# Patient Record
Sex: Female | Born: 1980
Health system: Southern US, Community
[De-identification: ages and names within clinical notes are randomized; demographics above are authoritative.]

## PROBLEM LIST (undated history)

## (undated) DIAGNOSIS — Z720 Tobacco use: Secondary | ICD-10-CM

## (undated) DIAGNOSIS — R823 Hemoglobinuria: Secondary | ICD-10-CM

## (undated) DIAGNOSIS — I10 Essential (primary) hypertension: Secondary | ICD-10-CM

## (undated) HISTORY — PX: CERVICAL CERCLAGE: SHX1329

## (undated) HISTORY — DX: Tobacco use: Z72.0

---
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
## 2016-04-29 ENCOUNTER — Encounter (HOSPITAL_COMMUNITY): Payer: Self-pay

## 2016-04-29 ENCOUNTER — Emergency Department (HOSPITAL_COMMUNITY)
Admission: EM | Admit: 2016-04-29 | Discharge: 2016-04-29 | Disposition: A | Discharge status: Home / Self Care | Payer: Self-pay | Attending: Emergency Medicine | Admitting: Emergency Medicine

## 2016-04-29 DIAGNOSIS — F17200 Nicotine dependence, unspecified, uncomplicated: Secondary | ICD-10-CM | POA: Insufficient documentation

## 2016-04-29 DIAGNOSIS — Z79899 Other long term (current) drug therapy: Secondary | ICD-10-CM | POA: Insufficient documentation

## 2016-04-29 DIAGNOSIS — I10 Essential (primary) hypertension: Secondary | ICD-10-CM | POA: Insufficient documentation

## 2016-04-29 HISTORY — DX: Essential (primary) hypertension: I10

## 2016-04-29 MED ORDER — LISINOPRIL-HYDROCHLOROTHIAZIDE 20-12.5 MG PO TABS: 1.0000 | ORAL_TABLET | Freq: Every day | ORAL | 0 refills | Status: DC

## 2016-04-29 MED ORDER — HYDROCHLOROTHIAZIDE 25 MG PO TABS
25.0000 mg | ORAL_TABLET | Freq: Every day | ORAL | Status: DC
  Administered 2016-04-29: 25 mg via ORAL
  Filled 2016-04-29: qty 1

## 2016-04-29 MED ORDER — LISINOPRIL 20 MG PO TABS
20.0000 mg | ORAL_TABLET | Freq: Once | ORAL | Status: AC
  Administered 2016-04-29: 20 mg via ORAL
  Filled 2016-04-29: qty 1

## 2016-04-29 NOTE — ED Notes (Signed)
Pt is in stable condition upon d/c and ambulates from ED. 

## 2016-04-29 NOTE — ED Triage Notes (Signed)
Pt presents with 2 day h/o headache.  Pt had her blood pressure checked and was referred here.  Pt just moved here and had been out of lisinopril/HCTZ x 3 days.

## 2016-04-29 NOTE — ED Provider Notes (Signed)
[facility]-EMERGENCY DEPT Provider Note   CSN: [id number] Arrival date & time: 04/29/16  1222     History   Chief Complaint No chief complaint on file.   HPI Hannah Lutz is a 35 y.o. female.  Pt presents to the ED today with HTN.  [place] [facility] and is out of her blood pressure medications.  She takes lisinopril/hctz.  The pt said that she had a headache, so she asked for some ibuprofen.  She said her boss made her go to the nurse.  They checked her blood pressure.  It was high, so they wanted her to come to the doctor.  She denies cp or any other sx.       Past Medical History:  Diagnosis Date  . Hypertension     There are no active problems to display for this patient.   History reviewed. No pertinent surgical history.  OB History    No data available       Home Medications    Prior to Admission medications   Medication Sig Start Date End Date Taking? Authorizing Provider  lisinopril-hydrochlorothiazide (ZESTORETIC) 20-12.5 MG tablet Take 1 tablet by mouth daily. 04/29/16   Jacalyn LefevreJulie Khadeeja Elden, MD    Family History History reviewed. No pertinent family history.  Social History Social History  Substance Use Topics  . Smoking status: Current Some Day Smoker  . Smokeless tobacco: Never Used  . Alcohol use Not on file     Allergies   Tramadol   Review of Systems Review of Systems  Neurological: Positive for headaches.  All other systems reviewed and are negative.    Physical Exam Updated Vital Signs BP (!) 154/120   Pulse 81   Temp 98 F (36.7 C) (Oral)   Resp 18   Ht 5\' 6"  (1.676 m)   Wt 190 lb (86.2 kg)   LMP 04/29/2016 (Exact Date)   SpO2 99%   BMI 30.67 kg/m   Physical Exam  Constitutional: She appears well-developed and well-nourished.  HENT:  Head: Normocephalic and atraumatic.  Right Ear: External ear normal.  Left Ear: External ear normal.  Nose: Nose normal.  Mouth/Throat: Oropharynx is clear and moist.    Eyes: Conjunctivae and EOM are normal. Pupils are equal, round, and reactive to light.  Neck: Normal range of motion. Neck supple.  Cardiovascular: Normal rate, regular rhythm, normal heart sounds and intact distal pulses.   Pulmonary/Chest: Effort normal and breath sounds normal.  Abdominal: Soft. Bowel sounds are normal.  Musculoskeletal: Normal range of motion.  Neurological: She is alert.  Skin: Skin is warm.  Psychiatric: She has a normal mood and affect. Her behavior is normal. Judgment and thought content normal.  Nursing note and vitals reviewed.    ED Treatments / Results  Labs (all labs ordered are listed, but only abnormal results are displayed) Labs Reviewed - No data to display  EKG  EKG Interpretation None       Radiology No results found.  Procedures Procedures (including critical care time)  Medications Ordered in ED Medications  lisinopril (PRINIVIL,ZESTRIL) tablet 20 mg (not administered)  hydrochlorothiazide (HYDRODIURIL) tablet 25 mg (not administered)     Initial Impression / Assessment and Plan / ED Course  I have reviewed the triage vital signs and the nursing notes.  Pertinent labs & imaging results that were available during my care of the patient were reviewed by me and considered in my medical decision making (see chart for details).  Clinical  Course     Pt's sx are chronic.  She will be given a rx for her medications.  Her insurance from work is kicking in soon, so she will establish a pcp as soon as that occurs.  She knows to return if worse.  Final Clinical Impressions(s) / ED Diagnoses   Final diagnoses:  Essential hypertension    New Prescriptions New Prescriptions   LISINOPRIL-HYDROCHLOROTHIAZIDE (ZESTORETIC) 20-12.5 MG TABLET    Take 1 tablet by mouth daily.     Jacalyn LefevreJulie Amorah Sebring, MD 04/29/16 [phone]

## 2016-04-29 NOTE — ED Notes (Signed)
Pt states she was sent here from her job to get a note stating she is able to work with no restrictions. Pt states she only needs her BP medications and the note. Pt informed that sounds reasonable.

## 2016-10-18 ENCOUNTER — Other Ambulatory Visit (HOSPITAL_COMMUNITY)
Admission: RE | Admit: 2016-10-18 | Discharge: 2016-10-18 | Disposition: A | Discharge status: Home / Self Care | Payer: 59 | Source: Ambulatory Visit | Attending: Physician Assistant | Admitting: Physician Assistant

## 2016-10-18 ENCOUNTER — Ambulatory Visit (INDEPENDENT_AMBULATORY_CARE_PROVIDER_SITE_OTHER): Payer: 59 | Admitting: Physician Assistant

## 2016-10-18 ENCOUNTER — Encounter: Payer: Self-pay | Admitting: Physician Assistant

## 2016-10-18 VITALS — BP 131/92 | HR 98 | Wt 191.0 lb

## 2016-10-18 DIAGNOSIS — Z0000 Encounter for general adult medical examination without abnormal findings: Secondary | ICD-10-CM

## 2016-10-18 DIAGNOSIS — Z803 Family history of malignant neoplasm of breast: Secondary | ICD-10-CM | POA: Diagnosis not present

## 2016-10-18 DIAGNOSIS — Z720 Tobacco use: Secondary | ICD-10-CM | POA: Diagnosis not present

## 2016-10-18 DIAGNOSIS — Z6830 Body mass index (BMI) 30.0-30.9, adult: Secondary | ICD-10-CM

## 2016-10-18 DIAGNOSIS — Z124 Encounter for screening for malignant neoplasm of cervix: Secondary | ICD-10-CM

## 2016-10-18 DIAGNOSIS — I10 Essential (primary) hypertension: Secondary | ICD-10-CM | POA: Diagnosis not present

## 2016-10-18 DIAGNOSIS — Z1231 Encounter for screening mammogram for malignant neoplasm of breast: Secondary | ICD-10-CM | POA: Diagnosis not present

## 2016-10-18 DIAGNOSIS — E6609 Other obesity due to excess calories: Secondary | ICD-10-CM | POA: Diagnosis not present

## 2016-10-18 DIAGNOSIS — Z1239 Encounter for other screening for malignant neoplasm of breast: Secondary | ICD-10-CM

## 2016-10-18 DIAGNOSIS — G4452 New daily persistent headache (NDPH): Secondary | ICD-10-CM | POA: Diagnosis not present

## 2016-10-18 DIAGNOSIS — Z7689 Persons encountering health services in other specified circumstances: Secondary | ICD-10-CM

## 2016-10-18 DIAGNOSIS — Z7251 High risk heterosexual behavior: Secondary | ICD-10-CM | POA: Diagnosis not present

## 2016-10-18 DIAGNOSIS — Z973 Presence of spectacles and contact lenses: Secondary | ICD-10-CM

## 2016-10-18 HISTORY — DX: New daily persistent headache (ndph): G44.52

## 2016-10-18 HISTORY — DX: Presence of spectacles and contact lenses: Z97.3

## 2016-10-18 LAB — CBC
HEMATOCRIT: 41 % (ref 35.0–45.0)
Hemoglobin: 12.9 g/dL (ref 11.7–15.5)
MCH: 25.3 pg — ABNORMAL LOW (ref 27.0–33.0)
MCHC: 31.5 g/dL — AB (ref 32.0–36.0)
MCV: 80.4 fL (ref 80.0–100.0)
MPV: 10.5 fL (ref 7.5–12.5)
PLATELETS: 370 10*3/uL (ref 140–400)
RBC: 5.1 MIL/uL (ref 3.80–5.10)
RDW: 14.5 % (ref 11.0–15.0)
WBC: 8 10*3/uL (ref 3.8–10.8)

## 2016-10-18 MED ORDER — VALSARTAN-HYDROCHLOROTHIAZIDE 160-25 MG PO TABS: 1.0000 | ORAL_TABLET | Freq: Every day | ORAL | 3 refills | Status: DC

## 2016-10-18 NOTE — Progress Notes (Signed)
HPI:                                                                Hannah Lutz is a 36 y.o. female who presents to [name] today to establish care / annual physical exam  Current Concerns include headache and blood pressure  HTN: taking Lisinopril-HCTZ. States she does not always take her medication, because she thinks it may be causing headaches. Patient also presented to ED on 04/29/16 with elevated BP and headache. Denies vision change, chest pain with exertion, orthopnea, lightheadedness, syncope and edema. Risk factors include: tobacco use  Patient reports daily frontal headaches that she describes as pressure behind her eyes. She reports they have been worsening for the last 2.5 months. She has been taking Excedrin and Tylenol daily, which initially helped, but she states the headaches always return. She has also been taking oral decongestants attributing the headaches to sinus congestion. She denies vision change, nausea, photophobia, paresthesias, or focal weakness. She denies nasal congestion, rhinorrhea, sneezing, or cough.  Patient also reports she had unprotected vaginal intercourse in March and has been having vaginal discharge since that time. Reports LMP 09/24/16. Denies vaginal lesions/rash, pelvic pain, fever or chills.   Patient is also requesting a screening mammogram for breast cancer today. She states her mother passed from breast cancer at age 37. Her maternal aunt and grandmother also had breast cancer.  Health Maintenance Health Maintenance  Topic Date Due  . HIV Screening  07/13/1993  . TETANUS/TDAP  07/13/1997  . PAP SMEAR  07/14/1999  . INFLUENZA VACCINE  01/15/2017    GYN/Sexual Health  Menstrual status:   LMP:  Menses:  Last pap smear:   History of abnormal pap smears:  Sexually active:  Current contraception:  Health Habits  Diet:  Exercise:  Past Medical History:  Diagnosis Date   . Hypertension   . Tobacco use    Past Surgical History:  Procedure Laterality Date  . CERVICAL CERCLAGE     Social History  Substance Use Topics  . Smoking status: Current Some Day Smoker    Types: Cigarettes  . Smokeless tobacco: Never Used  . Alcohol use Yes   family history includes Breast cancer in her maternal aunt, maternal grandmother, and mother; Hypertension in her father.  ROS: negative except as noted in the HPI  Medications: Current Outpatient Prescriptions  Medication Sig Dispense Refill  . valsartan-hydrochlorothiazide (DIOVAN-HCT) 160-25 MG tablet Take 1 tablet by mouth daily. 30 tablet 3   No current facility-administered medications for this visit.    Allergies  Allergen Reactions  . Tramadol Nausea And Vomiting       Objective:  BP (!) 131/92   Pulse 98   Wt 191 lb (86.6 kg)   BMI 30.83 kg/m  Gen: obese, not ill-appearing, no distress HEENT: normal conjunctiva, TM's clear, oropharynx clear, moist mucus membranes, no thyromegaly or tenderness Pulm: Normal work of breathing, normal phonation, clear to auscultation bilaterally CV: Normal rate, regular rhythm, s1 and s2 distinct, no murmurs, clicks or rubs, no carotid bruit GI: abdomen soft, nondistended, nontender, no masses GU: vulva without rashes or lesions, normal introitus and urethral meatus, vaginal mucosa without erythema, moderate amount of white discharge, cervix non-friable without  lesions, adnexa without masses or tenderness, uterus non-tender and not enlarged Neuro:  cranial nerves II-XII intact, no nystagmus, no papilledema, normal finger-to-nose, normal heel-to-shin, negative pronator drift, normal coordination, DTR's intact, normal tone, no tremor MSK: strength 5/5 and symmetric in bilateral upper and lower extremities, normal gait and station Mental Status: alert and oriented x 3, normal speech, cooperative, organized thought content Skin: warm and dry, no rashes or lesions on exposed  skin Psych: appearance casual, normal affect, euthymic mood  A chaperone was used for the GU portion of the exam Daniel Nones(Euleta Douglas, LPN)  No results found for this or any previous visit (from the past 72 hour(s)). No results found.  Depression screen PHQ 2/9 10/18/2016  Decreased Interest 0  Down, Depressed, Hopeless 0  PHQ - 2 Score 0     Assessment and Plan: 36 y.o. female with   Annual physical exam, Encounter to establish care - CBC - Comprehensive metabolic panel - Lipid Panel w/reflex Direct LDL - Hemoglobin A1c - negative depression screen - counseling provided on heart healthy lifestyle including but not limited to smoking cessation, maintaining a healthy weight, and regular cardiovascular exercise  Family history of breast cancer in first degree relative, Breast cancer screening, high risk patient - MM DIGITAL SCREENING BILATERAL; Future  Unprotected sexual intercourse - HIV antibody - Hepatitis C antibody - RPR - SureSwab, T.vaginalis RNA,Ql,Female - GC/Chlamydia Probe Amp - patient declined urine pregnancy test  Encounter for Papanicolaou smear for cervical cancer screening - Cytology - PAP w/HPV cotesting  New daily persistent headache - reassuring neurologic exam without focal deficits - MR BRAIN W WO CONTRAST - recommended patient discontinue OTC decongestants and pain relievers to avoid rebound headache - will switch antihypertensive medication to see if this makes a difference  Essential hypertension - Stop Lisinopril-HCTZ and start Valsartan-HCTZ daily - Check blood pressure at home for the next 2 weeks - Check around the same time each day in a relaxed setting (rest for at least 3 minutes, legs uncrossed, arm above level or your heart) - Limit salt. Follow DASH eating plan - Follow-up in 2 weeks for a nurse visit blood pressure check  Patient education and anticipatory guidance given Patient agrees with treatment plan Follow-up in 2 weeks or sooner  as needed  Levonne Hubertharley E. Rosina Cressler PA-C

## 2016-10-18 NOTE — Patient Instructions (Addendum)
For your blood pressure: - Stop Lisinopril-HCTZ and start Valsartan-HCTZ daily - Check blood pressure at home for the next 2 weeks - Check around the same time each day in a relaxed setting (rest for at least 3 minutes, legs uncrossed, arm above level or your heart) - Limit salt. Follow DASH eating plan - Follow-up in 2 weeks for a nurse visit blood pressure check   I have ordered your screening mammogram The best thing to do will be to contact your insurance company prior to having your mammogram to find out what your co-pay will be  Go downstairs for lab work. We will contact you within 2-3 business days with your results. Sometimes Pap smear results take longer   Physical Activity Recommendations for modifying lipids and lowering blood pressure Engage in aerobic physical activity to reduce LDL-cholesterol, non-HDL-cholesterol, and blood pressure  Frequency: 3-4 sessions per week  Intensity: moderate to vigorous  Duration: 40 minutes on average  Physical Activity Recommendations for secondary prevention 1. Aerobic exercise  Frequency: 3-5 sessions per week  Intensity: 50-80% capacity  Duration: 20 - 60 minutes  Examples: walking, treadmill, cycling, rowing, stair climbing, and arm/leg ergometry  2. Resistance exercise  Frequency: 2-3 sessions per week  Intensity: 10-15 repetitions/set to moderate fatigue  Duration: 1-3 sets of 8-10 upper and lower body exercises  Examples: calisthenics, elastic bands, cuff/hand weights, dumbbels, free weights, wall pulleys, and weight machines  Heart-Healthy Lifestyle  Eating a diet rich in vegetables, fruits and whole grains: also includes low-fat dairy products, poultry, fish, legumes, and nuts; limit intake of sweets, sugar-sweetened beverages and red meats  Getting regular exercise  Maintaining a healthy weight  Not smoking or getting help quitting  Staying on top of your health; for some people, lifestyle changes alone may  not be enough to prevent a heart attack or stroke. In these cases, taking a statin at the right dose will most likely be necessary   Preventive Care 18-39 Years, Female Preventive care refers to lifestyle choices and visits with your health care provider that can promote health and wellness. What does preventive care include?  A yearly physical exam. This is also called an annual well check.  Dental exams once or twice a year.  Routine eye exams. Ask your health care provider how often you should have your eyes checked.  Personal lifestyle choices, including:  Daily care of your teeth and gums.  Regular physical activity.  Eating a healthy diet.  Avoiding tobacco and drug use.  Limiting alcohol use.  Practicing safe sex.  Taking vitamin and mineral supplements as recommended by your health care provider. What happens during an annual well check? The services and screenings done by your health care provider during your annual well check will depend on your age, overall health, lifestyle risk factors, and family history of disease. Counseling  Your health care provider may ask you questions about your:  Alcohol use.  Tobacco use.  Drug use.  Emotional well-being.  Home and relationship well-being.  Sexual activity.  Eating habits.  Work and work Statistician.  Method of birth control.  Menstrual cycle.  Pregnancy history. Screening  You may have the following tests or measurements:  Height, weight, and BMI.  Diabetes screening. This is done by checking your blood sugar (glucose) after you have not eaten for a while (fasting).  Blood pressure.  Lipid and cholesterol levels. These may be checked every 5 years starting at age 1.  Skin check.  Hepatitis C  blood test.  Hepatitis B blood test.  Sexually transmitted disease (STD) testing.  BRCA-related cancer screening. This may be done if you have a family history of breast, ovarian, tubal, or  peritoneal cancers.  Pelvic exam and Pap test. This may be done every 3 years starting at age 73. Starting at age 1, this may be done every 5 years if you have a Pap test in combination with an HPV test. Discuss your test results, treatment options, and if necessary, the need for more tests with your health care provider. Vaccines  Your health care provider may recommend certain vaccines, such as:  Influenza vaccine. This is recommended every year.  Tetanus, diphtheria, and acellular pertussis (Tdap, Td) vaccine. You may need a Td booster every 10 years.  Varicella vaccine. You may need this if you have not been vaccinated.  HPV vaccine. If you are 82 or younger, you may need three doses over 6 months.  Measles, mumps, and rubella (MMR) vaccine. You may need at least one dose of MMR. You may also need a second dose.  Pneumococcal 13-valent conjugate (PCV13) vaccine. You may need this if you have certain conditions and were not previously vaccinated.  Pneumococcal polysaccharide (PPSV23) vaccine. You may need one or two doses if you smoke cigarettes or if you have certain conditions.  Meningococcal vaccine. One dose is recommended if you are age 62-21 years and a first-year college student living in a residence hall, or if you have one of several medical conditions. You may also need additional booster doses.  Hepatitis A vaccine. You may need this if you have certain conditions or if you travel or work in places where you may be exposed to hepatitis A.  Hepatitis B vaccine. You may need this if you have certain conditions or if you travel or work in places where you may be exposed to hepatitis B.  Haemophilus influenzae type b (Hib) vaccine. You may need this if you have certain risk factors. Talk to your health care provider about which screenings and vaccines you need and how often you need them. This information is not intended to replace advice given to you by your health care  provider. Make sure you discuss any questions you have with your health care provider. Document Released: 07/30/2001 Document Revised: 02/21/2016 Document Reviewed: 04/04/2015 Elsevier Interactive Patient Education  2017 [place], Adult Preventive dental care includes seeing a dentist regularly and practicing good dental care (oral hygiene) at home. These actions can help to prevent cavities and other tooth problems, root canal problems, gum disease (gingivitis), and tooth loss. Regular dental exams may also help your health care provider to diagnose other medical problems. Many diseases, including mouth cancers, have early signs that can be found during a preventive dental care visit. What can I expect during my dental visits? Many adults see their dentist one or two times each year for oral exams and cleanings. Talk with your dentist about the best preventive dental care schedule for you. At your visit, your dentist may ask you about:  Your overall health and diet.  Any new symptoms, such as:  Bleeding gums.  Mouth, tooth, or jaw pain. Your dentist will do a mouth (oral) exam to check for:  Cavities.  Gingivitis or other problems.  Signs of cancer.  Neck swelling or lumps.  Abnormal jaw movement or pain in the jaw joint. You may also have:  Dental X-rays.  Your teeth cleaned. If you  have an early problem, like a cavity, your dentist will schedule time for you to get treatment. If you have a tooth root problem, gum disease, or a sign of another disease, your dentist may send you to see another health care provider for care. How can I care for my teeth at home?  Brush with an approved fluoride toothpaste every morning and night. If possible, brush within 10 minutes after every meal.  Floss one time every day.  Periodically check your teeth for white or brown spots after brushing. These may be signs of cavities.  Check your gums for swelling or  bleeding. These may be signs of gum disease, such as gingivitis or periodontitis.  Make sure your diet includes plenty of fruits, vegetables, milk and dairy products, whole grains, and proteins. Do not eat a lot of starchy foods or foods with added sugar. Talk with your health care provider if you have questions about following a healthy diet.  Avoid sodas, sugary snacks, and sticky candies.  Do not smoke.  Do not get mouth piercings.  If you have tooth or gum pain, gargle with a salt-water mixture 3-4 times per day or as needed. To make a salt-water mixture, completely dissolve -1 tsp of salt in 1 cup of warm water.  Take over-the counter and prescription medicines only as told by your dentist.  If you have a permanent tooth knocked out:  Find the tooth.  Pick it up by the top (crown) with a tissue or gauze.  Wash the tooth for no more than 10 seconds under cold, running water.  Try to put the tooth back into the gum socket.  Put the tooth in a glass of milk if you cannot get it back in place.  Go to your dentist right away. Take the tooth with you. When should I seek medical care? Call your dentist if you have:  Gum, tooth, or jaw pain.  Red, swollen, or bleeding gums.  A tooth or teeth that are very sensitive to hot or cold.  Very bad breath.  A problem with a filling, crown, implant, or denture.  A broken or loose tooth.  A growth or sore in your mouth that is not going away. Where to find more information: American Dental Association: http://clayton-rivera.info/ This information is not intended to replace advice given to you by your health care provider. Make sure you discuss any questions you have with your health care provider. Document Released: 02/22/2015 Document Revised: 11/09/2015 Document Reviewed: 11/15/2014 Elsevier Interactive Patient Education  2017 [employer].

## 2016-10-19 LAB — COMPREHENSIVE METABOLIC PANEL
ALBUMIN: 3.8 g/dL (ref 3.6–5.1)
ALK PHOS: 71 U/L (ref 33–115)
ALT: 12 U/L (ref 6–29)
AST: 13 U/L (ref 10–30)
BILIRUBIN TOTAL: 0.5 mg/dL (ref 0.2–1.2)
BUN: 12 mg/dL (ref 7–25)
CO2: 23 mmol/L (ref 20–31)
CREATININE: 0.89 mg/dL (ref 0.50–1.10)
Calcium: 9.2 mg/dL (ref 8.6–10.2)
Chloride: 105 mmol/L (ref 98–110)
Glucose, Bld: 85 mg/dL (ref 65–99)
Potassium: 4.5 mmol/L (ref 3.5–5.3)
SODIUM: 138 mmol/L (ref 135–146)
TOTAL PROTEIN: 7.3 g/dL (ref 6.1–8.1)

## 2016-10-19 LAB — HEMOGLOBIN A1C
Hgb A1c MFr Bld: 5.2 % (ref <5.7)
MEAN PLASMA GLUCOSE: 103 mg/dL

## 2016-10-19 LAB — LIPID PANEL W/REFLEX DIRECT LDL
CHOLESTEROL: 161 mg/dL (ref <200)
HDL: 45 mg/dL — ABNORMAL LOW (ref >50)
LDL-Cholesterol: 96 mg/dL
Non-HDL Cholesterol (Calc): 116 mg/dL (ref <130)
TRIGLYCERIDES: 108 mg/dL (ref <150)
Total CHOL/HDL Ratio: 3.6 Ratio (ref <5.0)

## 2016-10-19 LAB — HIV ANTIBODY (ROUTINE TESTING W REFLEX): HIV: NONREACTIVE

## 2016-10-19 LAB — HEPATITIS C ANTIBODY: HCV AB: NEGATIVE

## 2016-10-19 LAB — RPR

## 2016-10-21 LAB — GC/CHLAMYDIA PROBE AMP
CT Probe RNA: NOT DETECTED
GC Probe RNA: NOT DETECTED

## 2016-10-21 LAB — SURESWAB, T.VAGINALIS RNA,QL,FEMALE: Trichomonas vaginalis RNA: NOT DETECTED

## 2016-10-22 LAB — CYTOLOGY - PAP
Adequacy: ABSENT
Diagnosis: NEGATIVE
HPV (WINDOPATH): NOT DETECTED

## 2017-08-04 ENCOUNTER — Other Ambulatory Visit: Payer: Self-pay | Admitting: Physician Assistant

## 2017-08-28 ENCOUNTER — Encounter: Payer: 59 | Admitting: Physician Assistant

## 2017-09-01 ENCOUNTER — Ambulatory Visit (INDEPENDENT_AMBULATORY_CARE_PROVIDER_SITE_OTHER): Payer: 59 | Admitting: Physician Assistant

## 2017-09-01 ENCOUNTER — Encounter: Payer: Self-pay | Admitting: Physician Assistant

## 2017-09-01 ENCOUNTER — Other Ambulatory Visit (HOSPITAL_COMMUNITY)
Admission: RE | Admit: 2017-09-01 | Discharge: 2017-09-01 | Disposition: A | Discharge status: Home / Self Care | Payer: 59 | Source: Ambulatory Visit | Attending: Physician Assistant | Admitting: Physician Assistant

## 2017-09-01 VITALS — BP 147/101 | HR 98 | Wt 203.0 lb

## 2017-09-01 DIAGNOSIS — Z1239 Encounter for other screening for malignant neoplasm of breast: Secondary | ICD-10-CM

## 2017-09-01 DIAGNOSIS — I10 Essential (primary) hypertension: Secondary | ICD-10-CM | POA: Diagnosis not present

## 2017-09-01 DIAGNOSIS — E6609 Other obesity due to excess calories: Secondary | ICD-10-CM

## 2017-09-01 DIAGNOSIS — Z0000 Encounter for general adult medical examination without abnormal findings: Secondary | ICD-10-CM | POA: Diagnosis not present

## 2017-09-01 DIAGNOSIS — Z7251 High risk heterosexual behavior: Secondary | ICD-10-CM

## 2017-09-01 DIAGNOSIS — Z1231 Encounter for screening mammogram for malignant neoplasm of breast: Secondary | ICD-10-CM

## 2017-09-01 DIAGNOSIS — Z803 Family history of malignant neoplasm of breast: Secondary | ICD-10-CM | POA: Diagnosis not present

## 2017-09-01 DIAGNOSIS — Z113 Encounter for screening for infections with a predominantly sexual mode of transmission: Secondary | ICD-10-CM

## 2017-09-01 HISTORY — DX: Encounter for other screening for malignant neoplasm of breast: Z12.39

## 2017-09-01 MED ORDER — CANDESARTAN CILEXETIL 32 MG PO TABS: ORAL_TABLET | ORAL | 5 refills | Status: DC

## 2017-09-01 NOTE — Patient Instructions (Addendum)
You cannot repeat your Pap smear until after May 4th  You will be contacted to schedule your mammogram  For your blood pressure: - Goal <130/80 - start Candesartan 1/2 tablet in the morning for 1 week, then increase to the whole tablet - monitor and log blood pressures at home - check around the same time each day in a relaxed setting - Limit salt to <2000 mg/day - Follow DASH eating plan - limit alcohol to 2 standard drinks per day for men and 1 per day for women - avoid tobacco products - weight loss: 7% of current body weight  Physical Activity Recommendations for modifying lipids and lowering blood pressure Engage in aerobic physical activity to reduce LDL-cholesterol, non-HDL-cholesterol, and blood pressure  Frequency: 3-4 sessions per week  Intensity: moderate to vigorous  Duration: 40 minutes on average   1. Aerobic exercise  Frequency: 3-5 sessions per week  Intensity: 50-80% capacity  Duration: 20 - 60 minutes  Examples: walking, treadmill, cycling, rowing, stair climbing, and arm/leg ergometry  2. Resistance exercise  Frequency: 2-3 sessions per week  Intensity: 10-15 repetitions/set to moderate fatigue  Duration: 1-3 sets of 8-10 upper and lower body exercises  Examples: calisthenics, elastic bands, cuff/hand weights, dumbbels, free weights, wall pulleys, and weight machines  Heart-Healthy Lifestyle  Eating a diet rich in vegetables, fruits and whole grains: also includes low-fat dairy products, poultry, fish, legumes, and nuts; limit intake of sweets, sugar-sweetened beverages and red meats  Getting regular exercise  Maintaining a healthy weight  Not smoking or getting help quitting  Staying on top of your health; for some people, lifestyle changes alone may not be enough to prevent a heart attack or stroke. In these cases, taking a statin at the right dose will most likely be necessary

## 2017-09-01 NOTE — Progress Notes (Signed)
HPI:                                                                Hannah HawthorneCosandra Lutz is a 37 y.o. female who [facility] today for annual physical exam  Current Concerns include STI testing  Reports unprotected intercourse with 1 female partner about 4 weeks ago. Denies vaginal discharge or GU rashes. Reports some intermittent itching of her inner thighs.   GYN/Sexual Health  Obstetrics: [phone]  Menstrual status: having periods  LMP: 08/23/2017  Menses: regular  Last pap smear: 10/2016, NILM, HPV negative  History of abnormal pap smears: yes, many years ago  Sexually active: yes  Current contraception: none  History of STI: yes  Dep[facility] screen [facility] 2/9 09/01/2017 10/18/2016  Decreased Interest 0 0  Down, Depressed, Hopeless 0 0  PHQ - 2 Score 0 0    Health Maintenance Health Maintenance  Topic Date Due  . TETANUS/TDAP  07/13/1997  . INFLUENZA VACCINE  05/04/2018 (Originally 01/15/2017)  . PAP SMEAR  10/19/2019  . HIV Screening  Completed    Past Medical History:  Diagnosis Date  . Hypertension   . Tobacco use    Past Surgical History:  Procedure Laterality Date  . CERVICAL CERCLAGE     Social History   Tobacco Use  . Smoking status: Current Some Day Smoker    Types: Cigarettes  . Smokeless tobacco: Never Used  Substance Use Topics  . Alcohol use: Yes   family history includes Breast cancer in her maternal aunt, maternal grandmother, and mother; Hypertension in her father.  ROS: negative except as noted in the HPI  Medications: Current Outpatient Medications  Medication Sig Dispense Refill  . valsartan-hydrochlorothiazide (DIOVAN-HCT) 160-25 MG tablet Take 1 tablet by mouth daily. Due for follow up visit 15 tablet 0   No current facility-administered medications for this visit.    Allergies  Allergen Reactions  . Tramadol Nausea And Vomiting       Objective:  BP (!) 147/101   Pulse  98   Wt 203 lb (92.1 kg)   LMP 08/23/2017 (Approximate)   BMI 32.77 kg/m  General Appearance:  Alert, cooperative, no distress, appropriate for age, obese female                            Head:  Normocephalic, without obvious abnormality                             Eyes:  PERRL, EOM's intact, conjunctiva and cornea clear, wearing contact lenses                             Ears:  TM pearly gray color and semitransparent, external ear canals normal, both ears                            Nose:  Nares symmetrical                          Throat:  Lips, tongue,  and mucosa are moist, pink, and intact; good dentition                             Neck:  Supple; symmetrical, trachea midline, no adenopathy; thyroid: no enlargement, symmetric, no tenderness/mass/nodules                             Back:  Symmetrical, no curvature, ROM normal               Chest/Breast:  Breasts symmetric, no skin dimpling, no nipple inversion, no palpable masses or tenderness                           Lungs:  Clear to auscultation bilaterally, respirations unlabored                             Heart:  regular rate & normal rhythm, S1 and S2 normal, no murmurs, rubs, or gallops                     Abdomen:  Soft, non-tender, no mass or organomegaly              Genitourinary:  vulva without rashes or lesions, normal introitus and urethral meatus, vaginal mucosa without erythema, normal discharge, cervix non-friable without lesions, there is suture material protruding from the cervical os         Musculoskeletal:  Tone and strength strong and symmetrical, all extremities; no joint pain or edema, normal gait and station                                       Lymphatic:  No adenopathy             Skin/Hair/Nails:  Skin warm, dry and intact, no rashes or abnormal dyspigmentation on limited exam                   Neurologic:  Alert and oriented x3, no cranial nerve deficits, DTR's intact, sensation grossly intact, normal gait and  station, no tremor Psych: well-groomed, cooperative, good eye contact, euthymic mood, affect mood-congruent, speech is articulate, and thought processes clear and goal-directed  A chaperone was used for the GU and breast portion of the exam, Olivia Mackie, CMA.    No results found for this or any previous visit (from the past 72 hour(s)). No results found.    Assessment and Plan: 37 y.o. female with   1. Encounter for annual physical exam - reviewed health maintenance - Pap UTD, NILM. She would like to do annual pap smears. Explained she will need to wait until after 10/18/2017 for insurance to pay for this - she is a candidate for early breast cancer screening due to family history - fasting labs 10 months ago  2. Unprotected sexual intercourse - asymptomatic, will defer treatment pending results. She has had a menstrual period - Hepatitis C antibody - HIV antibody - RPR - C. trachomatis/N. gonorrhoeae RNA - Trichomonas vaginalis, RNA  3. Routine screening for STI (sexually transmitted infection) - Hepatitis C antibody - HIV antibody - RPR - C. trachomatis/N. gonorrhoeae RNA - Trichomonas vaginalis, RNA  4. Hypertension goal BP (blood pressure) < 130/80 BP Readings  from Last 3 Encounters:  09/01/17 (!) 147/101  10/18/16 (!) 131/92  04/29/16 (!) 149/101  - reports headache on combination medication. This is likely the thiazide component. Cough with ACE. Will try ARB alone and add CCB if needed - counseled on therapeutic lifestyle changes - candesartan (ATACAND) 32 MG tablet; 1/2 tablet daily for 1 week, the 1 tablet daily  Dispense: 30 tablet; Refill: 5  5. Class 1 obesity due to excess calories with serious comorbidity in adult, unspecified BMI Wt Readings from Last 3 Encounters:  09/01/17 203 lb (92.1 kg)  10/18/16 191 lb (86.6 kg)  04/29/16 190 lb (86.2 kg)  - counseled on weight loss through decreased caloric intake and increased aerobic exercise   6. Family  history of breast cancer in first degree relative - MM SCREENING BREAST TOMO BILATERAL; Future  7. Breast cancer screening, high risk patient - family hx of breast ca in mother, maternal aunt and maternal GM. We discussed that she is a candidate for genetic testing, which may allow her to access annual breast MRI. She is going to contact her insurance company to find out about cost - MM SCREENING BREAST TOMO BILATERAL; Future    Patient education and anticipatory guidance given Patient agrees with treatment plan Follow-up in 2 weeks for nurse BP check, then every 6 months for medication management  Levonne Hubert PA-C

## 2017-09-02 ENCOUNTER — Other Ambulatory Visit: Payer: Self-pay

## 2017-09-02 ENCOUNTER — Encounter: Payer: Self-pay | Admitting: Physician Assistant

## 2017-09-02 ENCOUNTER — Other Ambulatory Visit: Payer: Self-pay | Admitting: Physician Assistant

## 2017-09-02 DIAGNOSIS — Z113 Encounter for screening for infections with a predominantly sexual mode of transmission: Secondary | ICD-10-CM

## 2017-09-02 DIAGNOSIS — Z7251 High risk heterosexual behavior: Secondary | ICD-10-CM

## 2017-09-02 NOTE — Progress Notes (Signed)
Erin HearingHey Ev, It looks like we didn't send the order with the specimen. This was because it was originally ordered on the Pap.  I have put the new order in, if you can contact [facility] to get this fixed. Thanks! Vinetta Bergamoharley

## 2017-09-03 LAB — CYTOLOGY - PAP
CHLAMYDIA, DNA PROBE: NEGATIVE
DIAGNOSIS: NEGATIVE
HPV (WINDOPATH): NOT DETECTED
Neisseria Gonorrhea: NEGATIVE
Trichomonas: NEGATIVE

## 2017-09-04 NOTE — Progress Notes (Signed)
Testing negative for gonorrhea, chlamydia and trichomonas Still waiting on blood work

## 2017-09-05 LAB — TEST AUTHORIZATION

## 2017-09-05 LAB — TIQ-NTM

## 2017-09-05 LAB — SURESWAB CT/NG/T. VAGINALIS
C. trachomatis RNA, TMA: NOT DETECTED
N. GONORRHOEAE RNA, TMA: NOT DETECTED
Trichomonas vaginalis RNA: NOT DETECTED

## 2017-09-05 LAB — HEPATITIS C ANTIBODY

## 2017-09-05 LAB — RPR

## 2017-09-05 LAB — HIV ANTIBODY (ROUTINE TESTING W REFLEX)

## 2017-09-08 ENCOUNTER — Other Ambulatory Visit: Payer: Self-pay | Admitting: Physician Assistant

## 2017-09-08 DIAGNOSIS — Z7251 High risk heterosexual behavior: Secondary | ICD-10-CM

## 2017-09-08 DIAGNOSIS — Z113 Encounter for screening for infections with a predominantly sexual mode of transmission: Secondary | ICD-10-CM

## 2017-09-08 NOTE — Progress Notes (Signed)
Testing for HIV, Hep C, and Syphilis was cancelled by the lab for an unknown reason Recommend patient return to the lab to complete this testing

## 2017-09-10 ENCOUNTER — Ambulatory Visit: Payer: 59

## 2017-09-11 ENCOUNTER — Ambulatory Visit (INDEPENDENT_AMBULATORY_CARE_PROVIDER_SITE_OTHER): Payer: 59

## 2017-09-11 DIAGNOSIS — R928 Other abnormal and inconclusive findings on diagnostic imaging of breast: Secondary | ICD-10-CM

## 2017-09-11 DIAGNOSIS — Z803 Family history of malignant neoplasm of breast: Secondary | ICD-10-CM

## 2017-09-11 DIAGNOSIS — Z1239 Encounter for other screening for malignant neoplasm of breast: Secondary | ICD-10-CM

## 2017-09-11 DIAGNOSIS — Z1231 Encounter for screening mammogram for malignant neoplasm of breast: Secondary | ICD-10-CM | POA: Diagnosis not present

## 2017-09-12 ENCOUNTER — Other Ambulatory Visit: Payer: Self-pay | Admitting: Physician Assistant

## 2017-09-12 DIAGNOSIS — R928 Other abnormal and inconclusive findings on diagnostic imaging of breast: Secondary | ICD-10-CM

## 2017-09-15 ENCOUNTER — Ambulatory Visit: Payer: 59

## 2017-09-16 ENCOUNTER — Other Ambulatory Visit: Payer: Self-pay | Admitting: Physician Assistant

## 2017-09-16 ENCOUNTER — Ambulatory Visit
Admission: RE | Admit: 2017-09-16 | Discharge: 2017-09-16 | Disposition: A | Discharge status: Home / Self Care | Payer: 59 | Source: Ambulatory Visit | Attending: Physician Assistant | Admitting: Physician Assistant

## 2017-09-16 DIAGNOSIS — N6320 Unspecified lump in the left breast, unspecified quadrant: Secondary | ICD-10-CM

## 2017-09-16 DIAGNOSIS — N6323 Unspecified lump in the left breast, lower outer quadrant: Secondary | ICD-10-CM | POA: Diagnosis not present

## 2017-09-16 DIAGNOSIS — R928 Other abnormal and inconclusive findings on diagnostic imaging of breast: Secondary | ICD-10-CM

## 2017-12-16 ENCOUNTER — Ambulatory Visit (INDEPENDENT_AMBULATORY_CARE_PROVIDER_SITE_OTHER): Payer: 59 | Admitting: Physician Assistant

## 2017-12-16 ENCOUNTER — Encounter: Payer: Self-pay | Admitting: Physician Assistant

## 2017-12-16 ENCOUNTER — Other Ambulatory Visit (HOSPITAL_COMMUNITY)
Admission: RE | Admit: 2017-12-16 | Discharge: 2017-12-16 | Disposition: A | Discharge status: Home / Self Care | Payer: 59 | Source: Ambulatory Visit | Attending: Physician Assistant | Admitting: Physician Assistant

## 2017-12-16 VITALS — BP 154/111 | HR 96 | Wt 204.0 lb

## 2017-12-16 DIAGNOSIS — I10 Essential (primary) hypertension: Secondary | ICD-10-CM

## 2017-12-16 DIAGNOSIS — A5901 Trichomonal vulvovaginitis: Secondary | ICD-10-CM

## 2017-12-16 DIAGNOSIS — Z124 Encounter for screening for malignant neoplasm of cervix: Secondary | ICD-10-CM | POA: Insufficient documentation

## 2017-12-16 DIAGNOSIS — N898 Other specified noninflammatory disorders of vagina: Secondary | ICD-10-CM

## 2017-12-16 MED ORDER — FLUCONAZOLE 150 MG PO TABS: ORAL_TABLET | ORAL | 0 refills | Status: DC

## 2017-12-16 MED ORDER — AMLODIPINE BESYLATE 5 MG PO TABS: 5.0000 mg | ORAL_TABLET | Freq: Every day | ORAL | 5 refills | Status: DC

## 2017-12-16 NOTE — Progress Notes (Signed)
HPI:                                                                Hannah HawthorneCosandra Lutz is a 37 y.o. female who [facility] [facility] today for Pap smear only  Current Concerns include vaginal discharge and itching  Persistent vaginal pruritus and intermittent burning vaginal pain for the last 4-6 months.  Denies any new sexual partners, last intercourse was 1 month ago, does not use condoms. Denies dysuria, urgency, frequency, hematuria. Denies pelvic pain or abnormal bleeding.  GYN/Sexual Health  Obstetrics: [phone]  Menstrual status: having periods  LMP: second week in June  Menses: regular  Last pap smear: 10/2016, NILM, HPV negative  History of abnormal pap smears: yes, many years ago  Sexually active: yes  Current contraception: none  History of STI: yes  Health Maintenance Health Maintenance  Topic Date Due  . TETANUS/TDAP  07/13/1997  . INFLUENZA VACCINE  05/04/2018 (Originally 01/15/2018)  . PAP SMEAR  09/01/2020  . HIV Screening  Completed    Past Medical History:  Diagnosis Date  . Hypertension   . Tobacco use    Past Surgical History:  Procedure Laterality Date  . CERVICAL CERCLAGE     Social History   Tobacco Use  . Smoking status: Current Some Day Smoker    Types: Cigarettes  . Smokeless tobacco: Never Used  Substance Use Topics  . Alcohol use: Yes   family history includes Breast cancer in her maternal aunt; Breast cancer (age of onset: 442) in her mother; Breast cancer (age of onset: 970) in her maternal grandmother; Hypertension in her father.  ROS: negative except as noted in the HPI  Medications: Current Outpatient Medications  Medication Sig Dispense Refill  . candesartan (ATACAND) 32 MG tablet 1/2 tablet daily for 1 week, the 1 tablet daily 30 tablet 5   No current facility-administered medications for this visit.    Allergies  Allergen Reactions  . Lisinopril Cough  . Tramadol  Nausea And Vomiting       Objective:  BP (!) 154/111   Pulse 96   Wt 204 lb (92.5 kg)   BMI 32.93 kg/m  Gen: alert, not ill-appearing, obese female GU: vulva without rashes or lesions, normal introitus and urethral meatus, vaginal mucosa without erythema, moderate amount of white discharge, cervix with evidence of prior cerclage, non-friable without lesions  A chaperone was used for the GU portion of the exam, Hannah Lutz, CMA.    No results found for this or any previous visit (from the past 72 hour(s)). No results found.    Assessment and Plan: 37 y.o. female with   Encounter for Pap smear of cervix with HPV DNA cotesting - Plan: Cytology - PAP, CANCELED: Cytology - PAP  Vaginal itching - Plan: Cytology - PAP, fluconazole (DIFLUCAN) 150 MG tablet  Hypertension goal BP (blood pressure) < 130/80 - Plan: amLODipine (NORVASC) 5 MG tablet  Trichomonal vaginitis - Plan: metroNIDAZOLE (FLAGYL) 500 MG tablet  HTN BP Readings from Last 3 Encounters:  12/16/17 (!) 154/111  09/01/17 (!) 147/101  10/18/16 (!) 131/92  - BP severely out of range. Patient having headaches, but otherwise asymptomatic - adding Amlodipine 5 mg to Candesartan 32 mg -  patient to monitor and log BP's at home - counseled on therapeutic lifestyle changes    Patient education and anticipatory guidance given Patient agrees with treatment plan Follow-up in 2 weeks for HTN or sooner as needed  Hannah Hubert PA-C

## 2017-12-16 NOTE — Patient Instructions (Addendum)
Call [phone] to schedule your MRI of your brain  For vaginal itching/discomfort: - take Diflucan once today. May repeat in 3 days if symptoms continue. May repeat again in 6 days if symptoms continue - avoid soaps, douching and excessive hygiene measures - cleanse skin gently with warm water and fingertips - wear loose-fitting clothing and cotton underwear  For your blood pressure: - Goal <130/80 - continue your candesartan every morning. Start Amlodipine every morning. Take both pills together daily - monitor and log blood pressures at home - check around the same time each day in a relaxed setting - Limit salt to <2000 mg/day - Follow DASH eating plan - limit alcohol to 2 standard drinks per day for men and 1 per day for women - avoid tobacco products - weight loss: 7% of current body weight - follow-up every 6 months for your blood pressure

## 2017-12-18 LAB — CYTOLOGY - PAP
BACTERIAL VAGINITIS: POSITIVE — AB
CANDIDA VAGINITIS: NEGATIVE
CHLAMYDIA, DNA PROBE: NEGATIVE
DIAGNOSIS: NEGATIVE
HPV (WINDOPATH): NOT DETECTED
Neisseria Gonorrhea: NEGATIVE
Trichomonas: POSITIVE — AB

## 2017-12-19 DIAGNOSIS — A5901 Trichomonal vulvovaginitis: Secondary | ICD-10-CM

## 2017-12-19 HISTORY — DX: Trichomonal vulvovaginitis: A59.01

## 2017-12-19 MED ORDER — METRONIDAZOLE 500 MG PO TABS: 500.0000 mg | ORAL_TABLET | Freq: Two times a day (BID) | ORAL | 0 refills | Status: AC

## 2017-12-26 DIAGNOSIS — B029 Zoster without complications: Secondary | ICD-10-CM | POA: Diagnosis not present

## 2018-03-23 ENCOUNTER — Other Ambulatory Visit: Payer: 59

## 2018-04-27 ENCOUNTER — Other Ambulatory Visit: Payer: 59

## 2018-07-01 ENCOUNTER — Ambulatory Visit: Payer: 59 | Admitting: Physician Assistant

## 2018-07-06 ENCOUNTER — Emergency Department (INDEPENDENT_AMBULATORY_CARE_PROVIDER_SITE_OTHER)
Admission: EM | Admit: 2018-07-06 | Discharge: 2018-07-06 | Disposition: A | Discharge status: Home / Self Care | Payer: 59 | Source: Home / Self Care | Attending: Emergency Medicine | Admitting: Emergency Medicine

## 2018-07-06 ENCOUNTER — Other Ambulatory Visit: Payer: Self-pay

## 2018-07-06 DIAGNOSIS — I10 Essential (primary) hypertension: Secondary | ICD-10-CM

## 2018-07-06 DIAGNOSIS — H5711 Ocular pain, right eye: Secondary | ICD-10-CM

## 2018-07-06 DIAGNOSIS — R42 Dizziness and giddiness: Secondary | ICD-10-CM

## 2018-07-06 NOTE — ED Provider Notes (Signed)
[facility]    CSN: [id number] Arrival date & time: 07/06/18  1130     History   Chief Complaint Chief Complaint  Patient presents with  . Hypertension  . Eye Problem    HPI Hannah Lutz is a 38 y.o. female.   HPI Patient states she has not felt well since Friday of last week.  Intermittently she has felt dizzy.  On Saturday she had a frontal type headache.  She has taken her blood pressures at home with blood pressure readings of 143/116 and 140/103.  She has also had a frontal headache with this.  Today she had discomfort and redness in her right eye with photophobia and is concerned about this.  She has a previous history of iritis in the distant past which required injections into her right eye.  She states she also feels dizzy without true vertigo. Past Medical History:  Diagnosis Date  . Hypertension   . Tobacco use     Patient Active Problem List   Diagnosis Date Noted  . Trichomonal vaginitis 12/19/2017  . Breast cancer screening, high risk patient 09/01/2017  . Family history of breast cancer in first degree relative 10/18/2016  . New daily persistent headache 10/18/2016  . Hypertension goal BP (blood pressure) < 130/80 10/18/2016  . Wears contact lenses 10/18/2016  . Class 1 obesity due to excess calories with serious comorbidity in adult 10/18/2016  . Tobacco use 10/18/2016    Past Surgical History:  Procedure Laterality Date  . CERVICAL CERCLAGE      OB History    Gravida  4   Para  3   Term      Preterm      AB  1   Living  3     SAB  1   TAB      Ectopic      Multiple      Live Births               Home Medications    Prior to Admission medications   Medication Sig Start Date End Date Taking? Authorizing Provider  amLODipine (NORVASC) 5 MG tablet Take 1 tablet (5 mg total) by mouth daily. 12/16/17   Carlis Stableummings, Charley Elizabeth, PA-C  candesartan (ATACAND) 32 MG tablet 1/2 tablet daily for 1 week, the 1 tablet  daily 09/01/17   Carlis Stableummings, Charley Elizabeth, PA-C  fluconazole (DIFLUCAN) 150 MG tablet 1 tab PO once. May repeat in 3 days if symptoms persist. May repeat in 6 days if symptoms persist 12/16/17   Carlis Stableummings, Charley Elizabeth, PA-C    Family History Family History  Problem Relation Age of Onset  . Breast cancer Mother [id number]  . Hypertension Father   . Breast cancer Maternal Aunt   . Breast cancer Maternal Grandmother 7070    Social History Social History   Tobacco Use  . Smoking status: Current Some Day Smoker    Types: Cigarettes  . Smokeless tobacco: Never Used  Substance Use Topics  . Alcohol use: Yes  . Drug use: No     Allergies   Lisinopril and Tramadol   Review of Systems Review of Systems  Constitutional: Negative.   HENT: Negative.   Eyes: Positive for photophobia, pain and redness.  Respiratory: Negative.   Cardiovascular: Negative.   Gastrointestinal: Negative.   Genitourinary: Negative.      Physical Exam Triage Vital Signs ED Triage Vitals  Enc Vitals Group     BP 07/06/18 1211 Marland Kitchen(!)  150/100     Pulse Rate 07/06/18 1211 94     Resp 07/06/18 1211 20     Temp 07/06/18 1211 98.3 F (36.8 C)     Temp Source 07/06/18 1211 Oral     SpO2 07/06/18 1211 98 %     Weight 07/06/18 1212 195 lb (88.5 kg)     Height 07/06/18 1212 5\' 5"  (1.651 m)     Head Circumference --      Peak Flow --      Pain Score 07/06/18 1212 0     Pain Loc --      Pain Edu? --      Excl. in GC? --    No data found.  Updated Vital Signs BP (!) 150/100 (BP Location: Right Arm)   Pulse 94   Temp 98.3 F (36.8 C) (Oral)   Resp 20   Ht 5\' 5"  (1.651 m)   Wt 88.5 kg   LMP 06/26/2018   SpO2 98%   BMI 32.45 kg/m   Visual Acuity Right Eye Distance: 20/20 Left Eye Distance: 20/25 Bilateral Distance: 20/20  Right Eye Near:   Left Eye Near:    Bilateral Near:     Physical Exam Constitutional:      Appearance: Normal appearance.  HENT:     Head: Normocephalic and atraumatic.      Nose: Nose normal.     Mouth/Throat:     Mouth: Mucous membranes are dry.  Eyes:     Comments: Pupils are equal and reactive to light.  Extraocular muscle movement is intact.  There is redness involving the inferior portion of the sclera of the right eye which extends to the corneal scleral junction.  Neck:     Musculoskeletal: Normal range of motion.  Cardiovascular:     Rate and Rhythm: Normal rate and regular rhythm.     Heart sounds: No murmur.  Pulmonary:     Effort: Pulmonary effort is normal.     Breath sounds: Normal breath sounds.  Neurological:     Mental Status: She is alert.    Blood pressure right arm seated 140/96. Blood pressure left arm seated 130/92  UC Treatments / Results  Labs (all labs ordered are listed, but only abnormal results are displayed) Labs Reviewed - No data to display  EKG None  Radiology No results found.  Procedures Procedures (including critical care time)  Medications Ordered in UC Medications - No data to display  Initial Impression / Assessment and Plan / UC Course  I have reviewed the triage vital signs and the nursing notes.  Pertinent labs & imaging results that were available during my care of the patient were reviewed by me and considered in my medical decision making (see chart for details). Patient with a history of hypertension and recent changes in blood pressure medications.  Blood pressure is not at goal at present.  She does have signs of an iritis of the right eye which she has struggled with and needed treatment in the past.  She is going to contact her ophthalmologist in [place] to see what the next step is.  She was also given a referral list of ophthalmologist in the [place] area where she could follow-up.  I did put in a referral through epic.  I advised her not to wear contacts.  She will follow-up her blood pressure at [facility].  I told her her dizziness may be related to her blood pressure  medications.  I did not feel her dizziness was related to her hypertension.  I suggested she discuss her medications with her PCP.     Final Clinical Impressions(s) / UC Diagnoses   Final diagnoses:  Essential hypertension  Eye pain, right  Dizziness and giddiness     Discharge Instructions     Please contact your physician regarding changes in blood pressure medication. Please contact your ophthalmologist regarding reevaluation for possible iritis.    ED Prescriptions    None     Controlled Substance Prescriptions [place] Controlled Substance Registry consulted? Not Applicable   Collene Gobble, MD 07/06/18 [phone]

## 2018-07-06 NOTE — Discharge Instructions (Addendum)
Please contact your physician regarding changes in blood pressure medication. Please contact your ophthalmologist regarding reevaluation for possible iritis.

## 2018-07-06 NOTE — ED Triage Notes (Signed)
Started Friday didn't feel good, and some dizziness. Saturday headache, bp 143/120 Sunday abd pain and diarrhea Today abd pain headache right eye red

## 2018-08-04 ENCOUNTER — Other Ambulatory Visit: Payer: Self-pay | Admitting: Physician Assistant

## 2018-08-04 DIAGNOSIS — I10 Essential (primary) hypertension: Secondary | ICD-10-CM

## 2018-08-19 ENCOUNTER — Ambulatory Visit (INDEPENDENT_AMBULATORY_CARE_PROVIDER_SITE_OTHER): Payer: 59 | Admitting: Physician Assistant

## 2018-08-19 ENCOUNTER — Encounter: Payer: Self-pay | Admitting: Physician Assistant

## 2018-08-19 ENCOUNTER — Ambulatory Visit (INDEPENDENT_AMBULATORY_CARE_PROVIDER_SITE_OTHER): Payer: 59

## 2018-08-19 VITALS — BP 142/90 | HR 84 | Temp 98.0°F | Wt 200.0 lb

## 2018-08-19 DIAGNOSIS — J111 Influenza due to unidentified influenza virus with other respiratory manifestations: Secondary | ICD-10-CM

## 2018-08-19 DIAGNOSIS — R0781 Pleurodynia: Secondary | ICD-10-CM | POA: Diagnosis not present

## 2018-08-19 DIAGNOSIS — F17200 Nicotine dependence, unspecified, uncomplicated: Secondary | ICD-10-CM

## 2018-08-19 DIAGNOSIS — I10 Essential (primary) hypertension: Secondary | ICD-10-CM

## 2018-08-19 DIAGNOSIS — B349 Viral infection, unspecified: Secondary | ICD-10-CM | POA: Diagnosis not present

## 2018-08-19 HISTORY — DX: Influenza due to unidentified influenza virus with other respiratory manifestations: J11.1

## 2018-08-19 HISTORY — DX: Pleurodynia: R07.81

## 2018-08-19 LAB — POCT INFLUENZA A/B
INFLUENZA A, POC: POSITIVE — AB
Influenza B, POC: POSITIVE — AB

## 2018-08-19 MED ORDER — OSELTAMIVIR PHOSPHATE 75 MG PO CAPS: 75.0000 mg | ORAL_CAPSULE | Freq: Two times a day (BID) | ORAL | 0 refills | Status: AC

## 2018-08-19 MED ORDER — AMLODIPINE BESYLATE 10 MG PO TABS: 10.0000 mg | ORAL_TABLET | Freq: Every day | ORAL | 0 refills | Status: DC

## 2018-08-19 NOTE — Progress Notes (Signed)
HPI:                                                                Hannah Lutz is a 38 y.o. female who presents to [name]: [facility] Medicine today for chest pain  Presents with 2 days of fatigue, generalized malaise, chest pain, and myalgias. Describes chest pain as only present with taking a deep breath "like I have a respiratory infection." Pain is not worse with exertion. Not reproducible.  Denies fever, cough, dyspnea, wheezing. No prior history of VTE. No peripheral edema or leg pain.  Taking Alka-seltzer  Multiple sick contacts - Reports son had influenza, she also visited a relative in the hospital with influenza and several co-workers have had respiratory infections No recent travel Risk factors include: tobacco use   Past Medical History:  Diagnosis Date  . Hypertension   . Tobacco use    Past Surgical History:  Procedure Laterality Date  . CERVICAL CERCLAGE     Social History   Tobacco Use  . Smoking status: Current Some Day Smoker    Types: Cigarettes  . Smokeless tobacco: Never Used  Substance Use Topics  . Alcohol use: Yes   family history includes Breast cancer in her maternal aunt; Breast cancer (age of onset: 62) in her mother; Breast cancer (age of onset: 79) in her maternal grandmother; Hypertension in her father.    ROS: negative except as noted in the HPI  Medications: Current Outpatient Medications  Medication Sig Dispense Refill  . amLODipine (NORVASC) 5 MG tablet Take 1 tablet (5 mg total) by mouth daily. 30 tablet 5  . candesartan (ATACAND) 32 MG tablet Take 1 tablet (32 mg total) by mouth daily. Due for follow up visit w/PCP 30 tablet 0   No current facility-administered medications for this visit.    Allergies  Allergen Reactions  . Lisinopril Cough  . Tramadol Nausea And Vomiting       Objective:  BP (!) 142/90   Pulse 84   Temp 98 F (36.7 C) (Oral)   Wt 200 lb (90.7 kg)   SpO2 98%    BMI 33.28 kg/m  Gen:  alert, not ill-appearing, no distress, appropriate for age, obese female HEENT: head normocephalic without obvious abnormality, conjunctiva and cornea clear, trachea midline Pulm: Normal work of breathing, normal phonation, clear to auscultation bilaterally, no wheezes, rales or rhonchi CV: Normal rate, regular rhythm, s1 and s2 distinct, no murmurs, clicks or rubs  Neuro: alert and oriented x 3, no tremor MSK: extremities atraumatic, normal gait and station Chest wall: atraumatic, non-tender Skin: intact, no rashes on exposed skin, no jaundice, no cyanosis Psych: well-groomed, cooperative, good eye contact, euthymic mood, affect mood-congruent, speech is articulate, and thought processes clear and goal-directed  Lab Results  Component Value Date   CREATININE 0.89 10/18/2016   BUN 12 10/18/2016   NA 138 10/18/2016   K 4.5 10/18/2016   CL 105 10/18/2016   CO2 23 10/18/2016   ECG 08/19/2018 14:20 Vent Rate 75 bpm PR-I 124 ms QRS 82 ms QT/QTc 392/437 ms Normal sinus rhythm with sinus arrhythmia     Assessment and Plan: 38 y.o. female with   .Diagnoses and all orders for this visit:  Acute viral  syndrome  Hypertension goal BP (blood pressure) < 130/80 -     amLODipine (NORVASC) 10 MG tablet; Take 1 tablet (10 mg total) by mouth daily. -     TSH + free T4 -     COMPLETE METABOLIC PANEL WITH GFR  Pleuritic chest pain -     CBC with Differential/Platelet -     TSH + free T4 -     DG Chest 2 View   Chest pain Pleuritic type, worse with deep inspiration She is afebrile, no tachypnea, no tachycardia, pulse ox 98% on RA at rest, no adventitious lung sounds Wells score 0 points, PERC rule 0 ECG showing NSR without ST or T wave abnormality, personally reviewed by me and supervising physician Dr. Lyn Hollingshead POC influenza testing positive for both A and B strains She is within the window for Tamiflu Counseled on supportive care Work note provided. She  was advised to remain out of work and public spaces for at least the next 48 hours CXR pending to assess for infiltrate  HTN BP out of range Patient prefers to take Amlodipine 10 mg even if BP is not at goal. Recommended she consider cutting the Candesartan in half, since she was experiencing headaches before starting the ARB, but she declined Overdue for renal function, CMP pending, no refills until labs completed  Patient education and anticipatory guidance given Patient agrees with treatment plan Follow-up in 6 months for HTN or sooner as needed if symptoms worsen or fail to improve  Levonne Hubert PA-C

## 2018-08-19 NOTE — Patient Instructions (Addendum)
Influenza, Adult Influenza, more commonly known as "the flu," is a viral infection that mainly affects the respiratory tract. The respiratory tract includes organs that help you breathe, such as the lungs, nose, and throat. The flu causes many symptoms similar to the common cold along with high fever and body aches. The flu spreads easily from person to person (is contagious). Getting a flu shot (influenza vaccination) every year is the best way to prevent the flu. What are the causes? This condition is caused by the influenza virus. You can get the virus by:  Breathing in droplets that are in the air from an infected person's cough or sneeze.  Touching something that has been exposed to the virus (has been contaminated) and then touching your mouth, nose, or eyes. What increases the risk? The following factors may make you more likely to get the flu:  Not washing or sanitizing your hands often.  Having close contact with many people during cold and flu season.  Touching your mouth, eyes, or nose without first washing or sanitizing your hands.  Not getting a yearly (annual) flu shot. You may have a higher risk for the flu, including serious problems such as a lung infection (pneumonia), if you:  Are older than 65.  Are pregnant.  Have a weakened disease-fighting system (immune system). You may have a weakened immune system if you: ? Have HIV or AIDS. ? Are undergoing chemotherapy. ? Are taking medicines that reduce (suppress) the activity of your immune system.  Have a long-term (chronic) illness, such as heart disease, kidney disease, diabetes, or lung disease.  Have a liver disorder.  Are severely overweight (morbidly obese).  Have anemia. This is a condition that affects your red blood cells.  Have asthma. What are the signs or symptoms? Symptoms of this condition usually begin suddenly and last 4-14 days. They may include:  Fever and chills.  Headaches, body aches, or  muscle aches.  Sore throat.  Cough.  Runny or stuffy (congested) nose.  Chest discomfort.  Poor appetite.  Weakness or fatigue.  Dizziness.  Nausea or vomiting. How is this diagnosed? This condition may be diagnosed based on:  Your symptoms and medical history.  A physical exam.  Swabbing your nose or throat and testing the fluid for the influenza virus. How is this treated? If the flu is diagnosed early, you can be treated with medicine that can help reduce how severe the illness is and how long it lasts (antiviral medicine). This may be given by mouth (orally) or through an IV. Taking care of yourself at home can help relieve symptoms. Your health care provider may recommend:  Taking over-the-counter medicines.  Drinking plenty of fluids. In many cases, the flu goes away on its own. If you have severe symptoms or complications, you may be treated in a hospital. Follow these instructions at home: Activity  Rest as needed and get plenty of sleep.  Stay home from work or school as told by your health care provider. Unless you are visiting your health care provider, avoid leaving home until your fever has been gone for 24 hours without taking medicine. Eating and drinking  Take an oral rehydration solution (ORS). This is a drink that is sold at pharmacies and retail stores.  Drink enough fluid to keep your urine pale yellow.  Drink clear fluids in small amounts as you are able. Clear fluids include water, ice chips, diluted fruit juice, and low-calorie sports drinks.  Eat bland, easy-to-digest foods   in small amounts as you are able. These foods include bananas, applesauce, rice, lean meats, toast, and crackers.  Avoid drinking fluids that contain a lot of sugar or caffeine, such as energy drinks, regular sports drinks, and soda.  Avoid alcohol.  Avoid spicy or fatty foods. General instructions      Take over-the-counter and prescription medicines only as told  by your health care provider.  Use a cool mist humidifier to add humidity to the air in your home. This can make it easier to breathe.  Cover your mouth and nose when you cough or sneeze.  Wash your hands with soap and water often, especially after you cough or sneeze. If soap and water are not available, use alcohol-based hand sanitizer.  Keep all follow-up visits as told by your health care provider. This is important. How is this prevented?   Get an annual flu shot. You may get the flu shot in late summer, fall, or winter. Ask your health care provider when you should get your flu shot.  Avoid contact with people who are sick during cold and flu season. This is generally fall and winter. Contact a health care provider if:  You develop new symptoms.  You have: ? Chest pain. ? Diarrhea. ? A fever.  Your cough gets worse.  You produce more mucus.  You feel nauseous or you vomit. Get help right away if:  You develop shortness of breath or difficulty breathing.  Your skin or nails turn a bluish color.  You have severe pain or stiffness in your neck.  You develop a sudden headache or sudden pain in your face or ear.  You cannot eat or drink without vomiting. Summary  Influenza, more commonly known as "the flu," is a viral infection that primarily affects your respiratory tract.  Symptoms of the flu usually begin suddenly and last 4-14 days.  Getting an annual flu shot is the best way to prevent getting the flu.  Stay home from work or school as told by your health care provider. Unless you are visiting your health care provider, avoid leaving home until your fever has been gone for 24 hours without taking medicine.  Keep all follow-up visits as told by your health care provider. This is important. This information is not intended to replace advice given to you by your health care provider. Make sure you discuss any questions you have with your health care  provider. Document Released: 05/31/2000 Document Revised: 11/19/2017 Document Reviewed: 11/19/2017 Elsevier Interactive Patient Education  2019 [employer]   Nonspecific Chest Pain, Adult Chest pain can be caused by many different conditions. It can be caused by a condition that is life-threatening and requires treatment right away. It can also be caused by something that is not life-threatening. If you have chest pain, it can be hard to know the difference, so it is important to get help right away to make sure that you do not have a serious condition. Some life-threatening causes of chest pain include:  Heart attack.  A tear in the body's main blood vessel (aortic dissection).  Inflammation around your heart (pericarditis).  A problem in the lungs, such as a blood clot (pulmonary embolism) or a collapsed lung (pneumothorax). Some non life-threatening causes of chest pain include:  Heartburn.  Anxiety or stress.  Damage to the bones, muscles, and cartilage that make up your chest wall.  Pneumonia or bronchitis.  Shingles infection (varicella-zoster virus). Chest pain can feel like:  Pain or  discomfort on the surface of your chest or deep in your chest.  Crushing, pressure, aching, or squeezing pain.  Burning or tingling.  Dull or sharp pain that is worse when you move, cough, or take a deep breath.  Pain or discomfort that is also felt in your back, neck, jaw, shoulder, or arm, or pain that spreads to any of these areas. Your chest pain may come and go. It may also be constant. Your health care provider will do lab tests and other studies to find the cause of your pain. Treatment will depend on the cause of your chest pain. Follow these instructions at home: Medicines  Take over-the-counter and prescription medicines only as told by your health care provider.  If you were prescribed an antibiotic, take it as told by your health care provider. Do not stop taking the  antibiotic even if you start to feel better. Lifestyle   Rest as directed by your health care provider.  Do not use any products that contain nicotine or tobacco, such as cigarettes and e-cigarettes. If you need help quitting, ask your health care provider.  Do not drink alcohol.  Make healthy lifestyle choices as recommended. These may include: ? Getting regular exercise. Ask your health care provider to suggest some activities that are safe for you. ? Eating a heart-healthy diet. This includes plenty of fresh fruits and vegetables, whole grains, low-fat (lean) protein, and low-fat dairy products. A dietitian can help you find healthy eating options. ? Maintaining a healthy weight. ? Managing any other health conditions you have, such as high blood pressure (hypertension) or diabetes. ? Reducing stress, such as with yoga or relaxation techniques. General instructions  Pay attention to any changes in your symptoms. Tell your health care provider about them or any new symptoms.  Avoid any activities that cause chest pain.  Keep all follow-up visits as told by your health care provider. This is important. This includes visits for any further testing if your chest pain does not go away. Contact a health care provider if:  Your chest pain does not go away.  You feel depressed.  You have a fever. Get help right away if:  Your chest pain gets worse.  You have a cough that gets worse, or you cough up blood.  You have severe pain in your abdomen.  You faint.  You have sudden, unexplained chest discomfort.  You have sudden, unexplained discomfort in your arms, back, neck, or jaw.  You have shortness of breath at any time.  You suddenly start to sweat, or your skin gets clammy.  You feel nausea or you vomit.  You suddenly feel lightheaded or dizzy.  You have severe weakness, or unexplained weakness or fatigue.  Your heart begins to beat quickly, or it feels like it is  skipping beats. These symptoms may represent a serious problem that is an emergency. Do not wait to see if the symptoms will go away. Get medical help right away. Call your local emergency services (911 in the [place] Do not drive yourself to the hospital. Summary  Chest pain can be caused by a condition that is serious and requires urgent treatment. It may also be caused by something that is not life-threatening.  If you have chest pain, it is very important to see your health care provider. Your health care provider may do lab tests and other studies to find the cause of your pain.  Follow your health care provider's instructions on taking medicines,  making lifestyle changes, and getting emergency treatment if symptoms become worse.  Keep all follow-up visits as told by your health care provider. This includes visits for any further testing if your chest pain does not go away. This information is not intended to replace advice given to you by your health care provider. Make sure you discuss any questions you have with your health care provider. Document Released: 03/13/2005 Document Revised: 12/04/2017 Document Reviewed: 12/04/2017 Elsevier Interactive Patient Education  2019 [employer].

## 2018-08-25 ENCOUNTER — Telehealth: Payer: Self-pay | Admitting: Physician Assistant

## 2018-08-25 ENCOUNTER — Encounter: Payer: Self-pay | Admitting: Physician Assistant

## 2018-08-25 NOTE — Telephone Encounter (Signed)
FYI No refills of Amlodipine until labs are completed

## 2018-10-09 ENCOUNTER — Other Ambulatory Visit: Payer: Self-pay | Admitting: Physician Assistant

## 2018-10-09 DIAGNOSIS — I10 Essential (primary) hypertension: Secondary | ICD-10-CM

## 2018-12-25 ENCOUNTER — Other Ambulatory Visit: Payer: Self-pay | Admitting: Physician Assistant

## 2018-12-25 DIAGNOSIS — I10 Essential (primary) hypertension: Secondary | ICD-10-CM

## 2019-01-14 ENCOUNTER — Encounter: Payer: Self-pay | Admitting: Physician Assistant

## 2019-01-14 ENCOUNTER — Ambulatory Visit (INDEPENDENT_AMBULATORY_CARE_PROVIDER_SITE_OTHER): Payer: 59 | Admitting: Physician Assistant

## 2019-01-14 ENCOUNTER — Other Ambulatory Visit: Payer: Self-pay

## 2019-01-14 VITALS — BP 134/96 | HR 128 | Temp 98.2°F | Ht 66.0 in | Wt 198.9 lb

## 2019-01-14 DIAGNOSIS — Z8619 Personal history of other infectious and parasitic diseases: Secondary | ICD-10-CM

## 2019-01-14 DIAGNOSIS — Z803 Family history of malignant neoplasm of breast: Secondary | ICD-10-CM | POA: Diagnosis not present

## 2019-01-14 DIAGNOSIS — Z0000 Encounter for general adult medical examination without abnormal findings: Secondary | ICD-10-CM

## 2019-01-14 DIAGNOSIS — N6320 Unspecified lump in the left breast, unspecified quadrant: Secondary | ICD-10-CM

## 2019-01-14 DIAGNOSIS — E785 Hyperlipidemia, unspecified: Secondary | ICD-10-CM | POA: Insufficient documentation

## 2019-01-14 DIAGNOSIS — Z87898 Personal history of other specified conditions: Secondary | ICD-10-CM | POA: Insufficient documentation

## 2019-01-14 DIAGNOSIS — Z1239 Encounter for other screening for malignant neoplasm of breast: Secondary | ICD-10-CM | POA: Diagnosis not present

## 2019-01-14 DIAGNOSIS — Z113 Encounter for screening for infections with a predominantly sexual mode of transmission: Secondary | ICD-10-CM

## 2019-01-14 DIAGNOSIS — F17210 Nicotine dependence, cigarettes, uncomplicated: Secondary | ICD-10-CM

## 2019-01-14 DIAGNOSIS — R000 Tachycardia, unspecified: Secondary | ICD-10-CM | POA: Insufficient documentation

## 2019-01-14 DIAGNOSIS — I10 Essential (primary) hypertension: Secondary | ICD-10-CM | POA: Diagnosis not present

## 2019-01-14 DIAGNOSIS — E6609 Other obesity due to excess calories: Secondary | ICD-10-CM

## 2019-01-14 HISTORY — DX: Personal history of other infectious and parasitic diseases: Z86.19

## 2019-01-14 HISTORY — DX: Personal history of other specified conditions: Z87.898

## 2019-01-14 MED ORDER — AMLODIPINE BESYLATE 10 MG PO TABS: 10.0000 mg | ORAL_TABLET | Freq: Every day | ORAL | 0 refills | Status: DC

## 2019-01-14 NOTE — Patient Instructions (Addendum)
For your blood pressure: - Goal <130/80 (Ideally 120's/70's) - take your blood pressure medication in the morning (unless instructed differently) - monitor and log blood pressures and heart rate at home - check around the same time each day in a relaxed setting - Limit salt to <2500 mg/day - Follow DASH (Dietary Approach to Stopping Hypertension) eating plan - Try to get at least 150 minutes of aerobic exercise per week - Aim to go on a brisk walk 30 minutes per day at least 5 days per week. If you're not active, gradually increase how long you walk by 5 minutes each week - limit alcohol: 2 standard drinks per day for men and 1 per day for women - avoid tobacco/nicotine products. Consider smoking cessation if you smoke - weight loss: 7% of current body weight can reduce your blood pressure by 5-10 points - follow-up at least every 6 months for your blood pressure. - follow-up sooner if your BP is not controlled   Sinus Tachycardia  Sinus tachycardia is a kind of fast heartbeat. In sinus tachycardia, the heart beats more than 100 times a minute. Sinus tachycardia starts in a part of the heart called the sinus node. Sinus tachycardia may be harmless, or it may be a sign of a serious condition. What are the causes? This condition may be caused by:  Exercise or exertion.  A fever.  Pain.  Loss of body fluids (dehydration).  Severe bleeding (hemorrhage).  Anxiety and stress.  Certain substances, including: ? Alcohol. ? Caffeine. ? Tobacco and nicotine products. ? Cold medicines. ? Illegal drugs.  Medical conditions including: ? Heart disease. ? An infection. ? An overactive thyroid (hyperthyroidism). ? A lack of red blood cells (anemia). What are the signs or symptoms? Symptoms of this condition include:  A feeling that the heart is beating quickly (palpitations).  Suddenly noticing your heartbeat (cardiac awareness).  Dizziness.  Tiredness (fatigue).  Shortness of  breath.  Chest pain.  Nausea.  Fainting. How is this diagnosed? This condition is diagnosed with:  A physical exam.  Other tests, such as: ? Blood tests. ? An electrocardiogram (ECG). This test measures the electrical activity of the heart. ? Ambulatory cardiac monitor. This records your heartbeats for 24 hours or more. You may be referred to a heart specialist (cardiologist). How is this treated? Treatment for this condition depends on the cause or the underlying condition. Treatment may involve:  Treating the underlying condition.  Taking new medicines or changing your current medicines as told by your health care provider.  Making changes to your diet or lifestyle. Follow these instructions at home: Lifestyle   Do not use any products that contain nicotine or tobacco, such as cigarettes and e-cigarettes. If you need help quitting, ask your health care provider.  Do not use illegal drugs, such as cocaine.  Learn relaxation methods to help you when you get stressed or anxious. These include deep breathing.  Avoid caffeine or other stimulants. Alcohol use   Do not drink alcohol if: ? Your health care provider tells you not to drink. ? You are pregnant, may be pregnant, or are planning to become pregnant.  If you drink alcohol, limit how much you have: ? 0-1 drink a day for women. ? 0-2 drinks a day for men.  Be aware of how much alcohol is in your drink. In the [place] one drink equals one typical bottle of beer (12 oz), one-half glass of wine (5 oz), or one shot of  hard liquor (1 oz). General instructions  Drink enough fluids to keep your urine pale yellow.  Take over-the-counter and prescription medicines only as told by your health care provider.  Keep all follow-up visits as told by your health care provider. This is important. Contact a health care provider if you have:  A fever.  Vomiting or diarrhea that does not go away. Get help right away if you:   Have pain in your chest, upper arms, jaw, or neck.  Become weak or dizzy.  Feel faint.  Have palpitations that do not go away. Summary  In sinus tachycardia, the heart beats more than 100 times a minute.  Sinus tachycardia may be harmless, or it may be a sign of a serious condition.  Treatment for this condition depends on the cause or the underlying condition.  Get help right away if you have pain in your chest, upper arms, jaw, or neck. This information is not intended to replace advice given to you by your health care provider. Make sure you discuss any questions you have with your health care provider. Document Released: 07/11/2004 Document Revised: 07/23/2017 Document Reviewed: 07/23/2017 Elsevier Patient Education  2020 [employer] Inc[employer]   Preventive Care 95-35 Years Old, Female Preventive care refers to visits with your health care provider and lifestyle choices that can promote health and wellness. This includes:  A yearly physical exam. This may also be called an annual well check.  Regular dental visits and eye exams.  Immunizations.  Screening for certain conditions.  Healthy lifestyle choices, such as eating a healthy diet, getting regular exercise, not using drugs or products that contain nicotine and tobacco, and limiting alcohol use. What can I expect for my preventive care visit? Physical exam Your health care provider will check your:  Height and weight. This may be used to calculate body mass index (BMI), which tells if you are at a healthy weight.  Heart rate and blood pressure.  Skin for abnormal spots. Counseling Your health care provider may ask you questions about your:  Alcohol, tobacco, and drug use.  Emotional well-being.  Home and relationship well-being.  Sexual activity.  Eating habits.  Work and work Statistician.  Method of birth control.  Menstrual cycle.  Pregnancy history. What immunizations do I need?  Influenza (flu)  vaccine  This is recommended every year. Tetanus, diphtheria, and pertussis (Tdap) vaccine  You may need a Td booster every 10 years. Varicella (chickenpox) vaccine  You may need this if you have not been vaccinated. Zoster (shingles) vaccine  You may need this after age 42. Measles, mumps, and rubella (MMR) vaccine  You may need at least one dose of MMR if you were born in 1957 or later. You may also need a second dose. Pneumococcal conjugate (PCV13) vaccine  You may need this if you have certain conditions and were not previously vaccinated. Pneumococcal polysaccharide (PPSV23) vaccine  You may need one or two doses if you smoke cigarettes or if you have certain conditions. Meningococcal conjugate (MenACWY) vaccine  You may need this if you have certain conditions. Hepatitis A vaccine  You may need this if you have certain conditions or if you travel or work in places where you may be exposed to hepatitis A. Hepatitis B vaccine  You may need this if you have certain conditions or if you travel or work in places where you may be exposed to hepatitis B. Haemophilus influenzae type b (Hib) vaccine  You may need this if  you have certain conditions. Human papillomavirus (HPV) vaccine  If recommended by your health care provider, you may need three doses over 6 months. You may receive vaccines as individual doses or as more than one vaccine together in one shot (combination vaccines). Talk with your health care provider about the risks and benefits of combination vaccines. What tests do I need? Blood tests  Lipid and cholesterol levels. These may be checked every 5 years, or more frequently if you are over 58 years old.  Hepatitis C test.  Hepatitis B test. Screening  Lung cancer screening. You may have this screening every year starting at age 45 if you have a 30-pack-year history of smoking and currently smoke or have quit within the past 15 years.  Colorectal cancer  screening. All adults should have this screening starting at age 8 and continuing until age 63. Your health care provider may recommend screening at age 61 if you are at increased risk. You will have tests every 1-10 years, depending on your results and the type of screening test.  Diabetes screening. This is done by checking your blood sugar (glucose) after you have not eaten for a while (fasting). You may have this done every 1-3 years.  Mammogram. This may be done every 1-2 years. Talk with your health care provider about when you should start having regular mammograms. This may depend on whether you have a family history of breast cancer.  BRCA-related cancer screening. This may be done if you have a family history of breast, ovarian, tubal, or peritoneal cancers.  Pelvic exam and Pap test. This may be done every 3 years starting at age 53. Starting at age 62, this may be done every 5 years if you have a Pap test in combination with an HPV test. Other tests  Sexually transmitted disease (STD) testing.  Bone density scan. This is done to screen for osteoporosis. You may have this scan if you are at high risk for osteoporosis. Follow these instructions at home: Eating and drinking  Eat a diet that includes fresh fruits and vegetables, whole grains, lean protein, and low-fat dairy.  Take vitamin and mineral supplements as recommended by your health care provider.  Do not drink alcohol if: ? Your health care provider tells you not to drink. ? You are pregnant, may be pregnant, or are planning to become pregnant.  If you drink alcohol: ? Limit how much you have to 0-1 drink a day. ? Be aware of how much alcohol is in your drink. In the [place] one drink equals one 12 oz bottle of beer (355 mL), one 5 oz glass of wine (148 mL), or one 1 oz glass of hard liquor (44 mL). Lifestyle  Take daily care of your teeth and gums.  Stay active. Exercise for at least 30 minutes on 5 or more days  each week.  Do not use any products that contain nicotine or tobacco, such as cigarettes, e-cigarettes, and chewing tobacco. If you need help quitting, ask your health care provider.  If you are sexually active, practice safe sex. Use a condom or other form of birth control (contraception) in order to prevent pregnancy and STIs (sexually transmitted infections).  If told by your health care provider, take low-dose aspirin daily starting at age 50. What's next?  Visit your health care provider once a year for a well check visit.  Ask your health care provider how often you should have your eyes and teeth checked.  Stay  up to date on all vaccines. This information is not intended to replace advice given to you by your health care provider. Make sure you discuss any questions you have with your health care provider. Document Released: 06/30/2015 Document Revised: 02/12/2018 Document Reviewed: 02/12/2018 Elsevier Patient Education  2020 [employer].

## 2019-01-14 NOTE — Progress Notes (Signed)
HPI:                                                                Hannah Lutz is a 38 y.o. female who presents to [facility]: Primary Care [facility] Medicine today for annual physical exam  Current Concerns include:  She would like to be screened for STI today. Denies known exposure, vaginal discharge or pruritus. Reports she occasionally has burning with urination. It improves when she drinks more water and seems to be worse when she drinks this caffeine supplement. Hx of trichomonas several years ago. Testing in 2018 and 2019 was negative.   Requesting refill of Amlodipine. Does not monitor BP at home  Denies vision change, headache, chest pain with exertion, orthopnea, lightheadedness, syncope and edema.  Currently smoking 2-3 cigarettes per day. States she is trying to quit and would like info about [facility] Risk factors include: tobacco use, obesity  Family hx of breast cancer: mother diagnosed age 4. Maternal aunt and MGM (unkown ages). Her last mammogram and [place] was 09/16/17 and showed a probably benign small mass in the left breast at 4 o'clock, 2 cm the nipple. She denies any breast tenderness, palpable masses, skin changes, or nipple discharge. She states she is interested in a breast reduction because she gets unwanted attention from men. Denies neck/back pain or breast size interfering with daily activity/exercise.  Depression screen [facility] 2/9 01/15/2019 09/01/2017 10/18/2016  Decreased Interest 0 0 0  Down, Depressed, Hopeless 0 0 0  PHQ - 2 Score 0 0 0    Health Maintenance Health Maintenance  Topic Date Due  . TETANUS/TDAP  01/14/2020 (Originally 07/13/1997)  . INFLUENZA VACCINE  01/16/2019  . PAP SMEAR-Modifier  12/16/2020  . HIV Screening  Completed    Past Medical History:  Diagnosis Date  . Hypertension   . Tobacco use    Past Surgical History:  Procedure Laterality Date  . CERVICAL CERCLAGE     Social History   Tobacco Use  . Smoking  status: Current Some Day Smoker    Types: Cigarettes  . Smokeless tobacco: Never Used  . Tobacco comment: 1-3 cigs/day  Substance Use Topics  . Alcohol use: Yes    Alcohol/week: 14.0 standard drinks    Types: 14 Glasses of wine per week   family history includes Breast cancer in her maternal aunt; Breast cancer (age of onset: 87) in her mother; Breast cancer (age of onset: 45) in her maternal grandmother; Cancer in her maternal aunt; Hypertension in her father.  ROS: negative except as noted in the HPI  Medications: Current Outpatient Medications  Medication Sig Dispense Refill  . amLODipine (NORVASC) 10 MG tablet Take 1 tablet (10 mg total) by mouth daily. Due for follow up in September 30 tablet 0   No current facility-administered medications for this visit.    Allergies  Allergen Reactions  . Candesartan Other (See Comments)    headache  . Lisinopril Cough  . Tramadol Nausea And Vomiting       Objective:  BP (!) 134/96   Pulse (!) 128   Temp 98.2 F (36.8 C) (Oral)   Ht '5\' 6"'  (1.676 m)   Wt 198 lb 14.4 oz (90.2 kg)   BMI 32.10 kg/m  Wt Readings from Last 3 Encounters:  01/14/19 198 lb 14.4 oz (90.2 kg)  08/19/18 200 lb (90.7 kg)  07/06/18 195 lb (88.5 kg)   Temp Readings from Last 3 Encounters:  01/14/19 98.2 F (36.8 C) (Oral)  08/19/18 98 F (36.7 C) (Oral)  07/06/18 98.3 F (36.8 C) (Oral)   BP Readings from Last 3 Encounters:  01/14/19 (!) 134/96  08/19/18 (!) 142/90  07/06/18 (!) 150/100   Pulse Readings from Last 3 Encounters:  01/14/19 (!) 128  08/19/18 84  07/06/18 94    General Appearance:  Alert, cooperative, no distress, appropriate for age, obese female                            Head:  Normocephalic, without obvious abnormality                             Eyes:  PERRL, EOM's intact, conjunctiva and cornea clear                             Ears:  TM pearly gray color and semitransparent, external ear canals normal, both ears                                                      Neck:  Supple; symmetrical, trachea midline, no adenopathy; thyroid: no enlargement, symmetric, no tenderness/mass/nodules                             Back:  Symmetrical, no curvature, ROM normal               Chest/Breast:  No tenderness, masses or nipple abnormality. No dimpling or discharge                           Lungs:  End expiratory wheeze of left upper lung field, respirations unlabored                             Heart:  Tachycardic rate approx 120 bpm, regular rhythm, S1 and S2 normal, no murmurs, rubs, or gallops                     Abdomen:  Soft, non-tender, no mass or organomegaly              Genitourinary:  deferred         Musculoskeletal:  Tone and strength strong and symmetrical, all extremities; no joint pain or edema, normal gait and station                                    Lymphatic:  No adenopathy             Skin/Hair/Nails:  Skin warm, dry and intact, no rashes or abnormal dyspigmentation on limited exam                   Neurologic:  Alert and oriented x3, no cranial nerve deficits, sensation grossly intact,  normal gait and station, no tremor Psych: well-groomed, cooperative, good eye contact, euthymic mood, affect mood-congruent, speech is articulate, and thought processes clear and goal-directed  A chaperone was present for the breast exam, Dessie Coma, CMA  ECG 01/14/19 Vent rate 115 bpm PR-I 132 ms QRS 78 ms QT/QTc 338/467 Sinus tachycardia  Assessment and Plan: 38 y.o. female with  .[name] was seen today for annual exam.  Diagnoses and all orders for this visit:  Encounter for annual physical exam -     COMPLETE METABOLIC PANEL WITH GFR -     TSH + free T4 -     CBC -     Lipid Panel w/reflex Direct LDL -     Urinalysis, Routine w reflex microscopic -     Lipid Panel w/reflex Direct LDL -     COMPLETE METABOLIC PANEL WITH GFR -     TSH + free T4 -     CBC -     Urinalysis, Routine w reflex  microscopic  Hypertension goal BP (blood pressure) < 130/80 -     amLODipine (NORVASC) 10 MG tablet; Take 1 tablet (10 mg total) by mouth daily. Due for follow up in September -     COMPLETE METABOLIC PANEL WITH GFR -     MICROSCOPIC MESSAGE -     COMPLETE METABOLIC PANEL WITH GFR -     Urinalysis, Routine w reflex microscopic  Tachycardia with heart rate 121-140 beats per minute -     COMPLETE METABOLIC PANEL WITH GFR -     TSH + free T4 -     CBC -     COMPLETE METABOLIC PANEL WITH GFR -     TSH + free T4 -     CBC -     EKG 12-Lead  Breast cancer screening, high risk patient -     Cancel: MM 3D SCREEN BREAST BILATERAL -     MM DIAG BREAST TOMO BILATERAL; Future -     US BREAST LTD UNI LEFT INC AXILLA; Future  History of trichomoniasis -     SURESWAB CT/NG/T. vaginalis  Routine screening for STI (sexually transmitted infection) -     SURESWAB CT/NG/T. vaginalis  History of dysuria -     Urinalysis, Routine w reflex microscopic -     MICROSCOPIC MESSAGE  Dyslipidemia, goal LDL below 100 -     Lipid Panel w/reflex Direct LDL -     Lipid Panel w/reflex Direct LDL  Family history of breast cancer in mother  Sinus tachycardia by electrocardiogram  Episodic cigarette smoking dependence  Mass of left breast on mammogram -     MM DIAG BREAST TOMO BILATERAL; Future -     US BREAST LTD UNI LEFT INC AXILLA; Future  Class 1 obesity due to excess calories with serious comorbidity in adult, unspecified BMI   - Personally reviewed PMH, PSH, PFH, medications, allergies, HM - Age-appropriate cancer screening: Pap UTD, patient is high risk for breast cancer (1 first degree relative and 2 second degree relatives with breast cancer and mother diagnosis was age 71). Clinical breast exam performed in office today. She is overdue for L breast US and diagnostic mammo, which were ordered today. Discussed that she is a good candidate for genetic testing and instructed to return for [facility]  myrisk hereditary cancer panel - Tdap declined - PHQ2 negative - Fall screen negative - Routine fasting labs pending - BMI >30 - counseled on weight loss through  decreased caloric intake and increased aerobic exercise   Sinus Tachycardia Patient noted to have resting heart rate of 122-128 on exam. BP hypertensive Patient admits that she smoked a cigarette and drank caffeine prior to appt and had very little water today She denied CP, lightheadedness, palpitations, irregular beat ECG personally reviewed by me and supervising physician (Dr. Sheppard Coil) showed sinus tachycardia at 115 bpm CMP, CBC, TSH pending Cont Amlodipine 10 mg QD Counseled to self-monitor BP and HR at home Therapeutic lifestyle changes Patient counseled to avoid/limit stimulants and push PO fluids  Patient education and anticipatory guidance given Patient agrees with treatment plan Follow-up in 2 weeks for HTN/tachycardia and genetic testing or sooner as needed  Darlyne Russian PA-C

## 2019-01-15 ENCOUNTER — Encounter: Payer: Self-pay | Admitting: Physician Assistant

## 2019-01-15 DIAGNOSIS — R000 Tachycardia, unspecified: Secondary | ICD-10-CM

## 2019-01-15 DIAGNOSIS — F17210 Nicotine dependence, cigarettes, uncomplicated: Secondary | ICD-10-CM

## 2019-01-15 DIAGNOSIS — N6320 Unspecified lump in the left breast, unspecified quadrant: Secondary | ICD-10-CM | POA: Insufficient documentation

## 2019-01-15 HISTORY — DX: Tachycardia, unspecified: R00.0

## 2019-01-15 HISTORY — DX: Nicotine dependence, cigarettes, uncomplicated: F17.210

## 2019-01-15 LAB — LIPID PANEL W/REFLEX DIRECT LDL
Cholesterol: 187 mg/dL (ref <200)
HDL: 52 mg/dL (ref >=50)
LDL Cholesterol (Calc): 112 mg/dL (calc) — ABNORMAL HIGH
Non-HDL Cholesterol (Calc): 135 mg/dL (calc) — ABNORMAL HIGH (ref <130)
Total CHOL/HDL Ratio: 3.6 (calc) (ref <5.0)
Triglycerides: 122 mg/dL (ref <150)

## 2019-01-15 LAB — COMPLETE METABOLIC PANEL WITH GFR
AG Ratio: 1.1 (calc) (ref 1.0–2.5)
ALT: 17 U/L (ref 6–29)
AST: 16 U/L (ref 10–30)
Albumin: 4.1 g/dL (ref 3.6–5.1)
Alkaline phosphatase (APISO): 93 U/L (ref 31–125)
BUN: 12 mg/dL (ref 7–25)
CO2: 26 mmol/L (ref 20–32)
Calcium: 9.6 mg/dL (ref 8.6–10.2)
Chloride: 107 mmol/L (ref 98–110)
Creat: 0.88 mg/dL (ref 0.50–1.10)
GFR, Est African American: 95 mL/min/{1.73_m2} (ref >=60)
GFR, Est Non African American: 82 mL/min/{1.73_m2} (ref >=60)
Globulin: 3.6 g/dL (calc) (ref 1.9–3.7)
Glucose, Bld: 94 mg/dL (ref 65–99)
Potassium: 3.8 mmol/L (ref 3.5–5.3)
Sodium: 138 mmol/L (ref 135–146)
Total Bilirubin: 0.2 mg/dL (ref 0.2–1.2)
Total Protein: 7.7 g/dL (ref 6.1–8.1)

## 2019-01-15 LAB — CBC
HCT: 39.3 % (ref 35.0–45.0)
Hemoglobin: 12.6 g/dL (ref 11.7–15.5)
MCH: 25 pg — ABNORMAL LOW (ref 27.0–33.0)
MCHC: 32.1 g/dL (ref 32.0–36.0)
MCV: 78 fL — ABNORMAL LOW (ref 80.0–100.0)
MPV: 10.8 fL (ref 7.5–12.5)
Platelets: 349 10*3/uL (ref 140–400)
RBC: 5.04 10*6/uL (ref 3.80–5.10)
RDW: 13.5 % (ref 11.0–15.0)
WBC: 8.9 10*3/uL (ref 3.8–10.8)

## 2019-01-15 LAB — URINALYSIS, ROUTINE W REFLEX MICROSCOPIC
Bacteria, UA: NONE SEEN /HPF
Bilirubin Urine: NEGATIVE
Glucose, UA: NEGATIVE
Hyaline Cast: NONE SEEN /LPF
Ketones, ur: NEGATIVE
Leukocytes,Ua: NEGATIVE
Nitrite: NEGATIVE
Protein, ur: NEGATIVE
Specific Gravity, Urine: 1.012 (ref 1.001–1.03)
WBC, UA: NONE SEEN /HPF (ref 0–5)
pH: 6.5 (ref 5.0–8.0)

## 2019-01-15 LAB — TSH+FREE T4: TSH W/REFLEX TO FT4: 0.47 mIU/L

## 2019-01-16 LAB — SURESWAB CT/NG/T. VAGINALIS
C. trachomatis RNA, TMA: NOT DETECTED
N. gonorrhoeae RNA, TMA: NOT DETECTED
Trichomonas vaginalis RNA: NOT DETECTED

## 2019-01-16 LAB — URINALYSIS, ROUTINE W REFLEX MICROSCOPIC

## 2019-01-16 LAB — LIPID PANEL W/REFLEX DIRECT LDL

## 2019-01-16 LAB — TSH+FREE T4

## 2019-01-16 LAB — COMPLETE METABOLIC PANEL WITH GFR

## 2019-01-16 LAB — CBC

## 2019-01-22 ENCOUNTER — Ambulatory Visit: Payer: 59 | Admitting: Physician Assistant

## 2019-01-22 ENCOUNTER — Other Ambulatory Visit: Payer: Self-pay

## 2019-01-22 ENCOUNTER — Ambulatory Visit: Payer: 59

## 2019-01-22 DIAGNOSIS — R319 Hematuria, unspecified: Secondary | ICD-10-CM

## 2019-01-23 LAB — URINALYSIS, ROUTINE W REFLEX MICROSCOPIC
Bacteria, UA: NONE SEEN /HPF
Bilirubin Urine: NEGATIVE
Glucose, UA: NEGATIVE
Hyaline Cast: NONE SEEN /LPF
Ketones, ur: NEGATIVE
Leukocytes,Ua: NEGATIVE
Nitrite: NEGATIVE
Specific Gravity, Urine: 1.018 (ref 1.001–1.03)
pH: 6.5 (ref 5.0–8.0)

## 2019-01-26 ENCOUNTER — Encounter: Payer: Self-pay | Admitting: Physician Assistant

## 2019-01-26 DIAGNOSIS — R823 Hemoglobinuria: Secondary | ICD-10-CM

## 2019-01-26 HISTORY — DX: Hemoglobinuria: R82.3

## 2019-01-28 ENCOUNTER — Ambulatory Visit: Payer: 59 | Admitting: Physician Assistant

## 2019-02-17 ENCOUNTER — Ambulatory Visit (INDEPENDENT_AMBULATORY_CARE_PROVIDER_SITE_OTHER): Payer: 59 | Admitting: Family Medicine

## 2019-02-17 ENCOUNTER — Other Ambulatory Visit: Payer: Self-pay

## 2019-02-17 ENCOUNTER — Encounter: Payer: Self-pay | Admitting: Family Medicine

## 2019-02-17 VITALS — BP 121/82 | HR 97 | Temp 98.1°F | Wt 205.0 lb

## 2019-02-17 DIAGNOSIS — R600 Localized edema: Secondary | ICD-10-CM | POA: Diagnosis not present

## 2019-02-17 DIAGNOSIS — M79672 Pain in left foot: Secondary | ICD-10-CM

## 2019-02-17 DIAGNOSIS — I10 Essential (primary) hypertension: Secondary | ICD-10-CM | POA: Diagnosis not present

## 2019-02-17 MED ORDER — AMLODIPINE BESYLATE 5 MG PO TABS: 5.0000 mg | ORAL_TABLET | Freq: Every day | ORAL | 3 refills | Status: DC

## 2019-02-17 MED ORDER — HYDROCHLOROTHIAZIDE 25 MG PO TABS: 25.0000 mg | ORAL_TABLET | Freq: Every day | ORAL | 3 refills | Status: DC

## 2019-02-17 MED ORDER — DICLOFENAC SODIUM 1 % TD GEL: 4.0000 g | Freq: Four times a day (QID) | TRANSDERMAL | 11 refills | Status: DC

## 2019-02-17 NOTE — Progress Notes (Signed)
Hannah HawthorneCosandra Lutz is a 38 y.o. female who [facility] today for bilateral leg swelling.  Patient notes a few day history of bilateral lower extremity swelling.  She currently takes amlodipine.  She is been on 5 mg for quite a while but her dose was increased relatively recently.  She is not sure if that caused her leg swelling or not.  She also has some pain in the left foot.  She notes that a few days ago [place] went to [place] a bit and thinks that may have contributed to her foot pain.  She notes the pain is located at the plantar aspect of her hindfoot and midfoot extending sometimes to the forefoot.  She is worse with standing and better with rest.  She denies significant calf pain.  She denies any unilateral swelling and she denies any palpable cords or varicose veins in her legs.  She has tried some Tylenol for pain which helps a little.  ROS as above:  Exam:  BP 121/82   Pulse 97   Temp 98.1 F (36.7 C) (Oral)   Wt 205 lb (93 kg)   BMI 33.09 kg/m  Wt Readings from Last 5 Encounters:  02/17/19 205 lb (93 kg)  01/14/19 198 lb 14.4 oz (90.2 kg)  08/19/18 200 lb (90.7 kg)  07/06/18 195 lb (88.5 kg)  12/16/17 204 lb (92.5 kg)    Gen: Well NAD HEENT: EOMI,  MMM Lungs: Normal work of breathing. CTABL Heart: RRR no MRG Abd: NABS, Soft. Nondistended, Nontender Exts: Brisk capillary refill, warm and well perfused.  1+ edema bilateral lower extremities.  Negative calf tenderness.  No palpable cords.  No varicose veins. Left foot slightly swollen and foot and ankle with edema mildly. Mildly tender palpation across plantar midfoot.  Not particular tender at plantar fascial insertion.  Normal foot and ankle motion.  Intact strength.  Pulses cap refill and sensation are intact distally.  Lab and Radiology Results   Chemistry      Component  Value Date/Time   NA 138 01/14/2019 1558   K 3.8 01/14/2019 1558   CL 107 01/14/2019 1558   CO2 26 01/14/2019 1558   BUN 12 01/14/2019 1558   CREATININE 0.88 01/14/2019 1558      Component Value Date/Time   CALCIUM 9.6 01/14/2019 1558   ALKPHOS 71 10/18/2016 0946   AST 16 01/14/2019 1558   ALT 17 01/14/2019 1558   BILITOT 0.2 01/14/2019 1558        Assessment and Plan: 38 y.o. female with  Lower extremity edema.  Very likely secondary to higher doses of amlodipine.  Plan to decrease amlodipine to 5 mg and add hydrochlorothiazide.  This should help with leg swelling and keep blood pressure stabilized.  We will also use compression stockings as needed.  As for the foot pain patient had increased activity recently with walking.  The foot pain is likely secondary to the increased activity but also the swelling in the foot and ankle probably is contributing to the pain as well.  Hopefully with reduced swelling she will have improved pain.  Additionally is diclofenac gel and relative rest.  If not improving recheck for reevaluation.  DVT quite unlikely.  DVT is symmetrical and patient denies any palpable cords or calf tenderness.  However if not improving or worsening neck step would likely be duplex ultrasound to assess for DVT.  PDMP not reviewed  this encounter. No orders of the defined types were placed in this encounter.  Meds ordered this encounter  Medications  . amLODipine (NORVASC) 5 MG tablet    Sig: Take 1 tablet (5 mg total) by mouth daily.    Dispense:  90 tablet    Refill:  3  . hydrochlorothiazide (HYDRODIURIL) 25 MG tablet    Sig: Take 1 tablet (25 mg total) by mouth daily.    Dispense:  90 tablet    Refill:  3  . diclofenac sodium (VOLTAREN) 1 % GEL    Sig: Apply 4 g topically 4 (four) times daily. To affected joint.    Dispense:  100 g    Refill:  11     Historical information moved to improve visibility of documentation.  Past Medical History:  Diagnosis  Date  . 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 Hypertension   . Tobacco use    Past Surgical History:  Procedure Laterality Date  . CERVICAL CERCLAGE     Social History   Tobacco Use  . Smoking status: Current Some Day Smoker    Types: Cigarettes  . Smokeless tobacco: Never Used  . Tobacco comment: 1-3 cigs/day  Substance Use Topics  . Alcohol use: Yes    Alcohol/week: 14.0 standard drinks    Types: 14 Glasses of wine per week   family history includes Breast cancer in her maternal aunt; Breast cancer (age of onset: 4) in her mother; Breast cancer (age of onset: 106) in her maternal grandmother; Cancer in her maternal aunt; Hypertension in her father.  Medications: Current Outpatient Medications  Medication Sig Dispense Refill  . amLODipine (NORVASC) 5 MG tablet Take 1 tablet (5 mg total) by mouth daily. 90 tablet 3  . diclofenac sodium (VOLTAREN) 1 % GEL Apply 4 g topically 4 (four) times daily. To affected joint. 100 g 11  . hydrochlorothiazide (HYDRODIURIL) 25 MG tablet Take 1 tablet (25 mg total) by mouth daily. 90 tablet 3   No current facility-administered medications for this visit.    Allergies  Allergen Reactions  . Candesartan Other (See Comments)    headache  . Lisinopril Cough  . Tramadol Nausea And Vomiting     Discussed warning signs or symptoms. Please see discharge instructions. Patient expresses understanding.

## 2019-02-17 NOTE — Patient Instructions (Signed)
Thank you for coming in today. I think the swelling is probably due to the 10mg  dose of Amlodipine.  Decrease to 5mg  (new prescription sent) and add HCTZ for blood pressure and fluid pill.   The foot pain is Liley due to the swelling and due to stress due to increased walking this weekend.  Apply diclofenac gel for pain as needed and we will reduce the swelling which should help.   Let me know if you need a work note or if not better.     Edema  Edema is when you have too much fluid in your body or under your skin. Edema may make your legs, feet, and ankles swell up. Swelling is also common in looser tissues, like around your eyes. This is a common condition. It gets more common as you get older. There are many possible causes of edema. Eating too much salt (sodium) and being on your feet or sitting for a long time can cause edema in your legs, feet, and ankles. Hot weather may make edema worse. Edema is usually painless. Your skin may look swollen or shiny. Follow these instructions at home:  Keep the swollen body part raised (elevated) above the level of your heart when you are sitting or lying down.  Do not sit still or stand for a long time.  Do not wear tight clothes. Do not wear garters on your upper legs.  Exercise your legs. This can help the swelling go down.  Wear elastic bandages or support stockings as told by your doctor.  Eat a low-salt (low-sodium) diet to reduce fluid as told by your doctor.  Depending on the cause of your swelling, you may need to limit how much fluid you drink (fluid restriction).  Take over-the-counter and prescription medicines only as told by your doctor. Contact a doctor if:  Treatment is not working.  You have heart, liver, or kidney disease and have symptoms of edema.  You have sudden and unexplained weight gain. Get help right away if:  You have shortness of breath or chest pain.  You cannot breathe when you lie down.  You have  pain, redness, or warmth in the swollen areas.  You have heart, liver, or kidney disease and get edema all of a sudden.  You have a fever and your symptoms get worse all of a sudden. Summary  Edema is when you have too much fluid in your body or under your skin.  Edema may make your legs, feet, and ankles swell up. Swelling is also common in looser tissues, like around your eyes.  Raise (elevate) the swollen body part above the level of your heart when you are sitting or lying down.  Follow your doctor's instructions about diet and how much fluid you can drink (fluid restriction). This information is not intended to replace advice given to you by your health care provider. Make sure you discuss any questions you have with your health care provider. Document Released: 11/20/2007 Document Revised: 06/06/2017 Document Reviewed: 06/21/2016 Elsevier Patient Education  2020 [employer].

## 2019-04-29 ENCOUNTER — Ambulatory Visit (INDEPENDENT_AMBULATORY_CARE_PROVIDER_SITE_OTHER): Payer: 59 | Admitting: Sports Medicine

## 2019-04-29 ENCOUNTER — Encounter: Payer: Self-pay | Admitting: Sports Medicine

## 2019-04-29 ENCOUNTER — Other Ambulatory Visit: Payer: Self-pay

## 2019-04-29 DIAGNOSIS — H9201 Otalgia, right ear: Secondary | ICD-10-CM

## 2019-04-29 DIAGNOSIS — H6123 Impacted cerumen, bilateral: Secondary | ICD-10-CM | POA: Diagnosis not present

## 2019-04-29 HISTORY — DX: Otalgia, right ear: H92.01

## 2019-04-29 MED ORDER — AMOXICILLIN-POT CLAVULANATE 875-125 MG PO TABS: 1.0000 | ORAL_TABLET | Freq: Two times a day (BID) | ORAL | 0 refills | Status: AC

## 2019-04-29 NOTE — Progress Notes (Signed)
Subjective:    CC: Right ear pain  HPI: For couple of weeks this 38 year old female has had pain in her right ear, it comes as random stabs of pain, minimal fullness, moderate, persistent, localized without radiation.  No difficulty with hearing, no tinnitus.  I reviewed the past medical history, family history, social history, surgical history, and allergies today and no changes were needed.  Please see the problem list section below in epic for further details.  Past Medical History: Past Medical History:  Diagnosis Date  . 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 Hypertension   . Tobacco use    Past Surgical History: Past Surgical History:  Procedure Laterality Date  . CERVICAL CERCLAGE     Social History: Social History   Socioeconomic History  . Marital status: Single    Spouse name: Not on file  . Number of children: Not on file  . Years of education: Not on file  . Highest education level: Not on file  Occupational History  . Not on file  Social Needs  . Financial resource strain: Not on file  . Food insecurity    Worry: Not on file    Inability: Not on file  . Transportation needs    Medical: Not on file    Non-medical: Not on file  Tobacco Use  . Smoking status: Current Some Day Smoker    Types: Cigarettes  . Smokeless tobacco: Never Used  . Tobacco comment: 1-3 cigs/day  Substance and Sexual Activity  . Alcohol use: Yes    Alcohol/week: 14.0 standard drinks    Types: 14 Glasses of wine per week  . Drug use: No  . Sexual activity: Yes    Birth control/protection: None  Lifestyle  . Physical activity    Days per week: Not on file    Minutes per session: Not on file  . Stress: Not on file  Relationships  . Social Musician on phone: Not on file    Gets together: Not on file    Attends religious service: Not on file    Active member of club or organization: Not on file    Attends meetings of clubs or organizations: Not on file    Relationship  status: Not on file  Other Topics Concern  . Not on file  Social History Narrative  . Not on file   Family History: Family History  Problem Relation Age of Onset  . Breast cancer Mother 38  . Hypertension Father   . Breast cancer Maternal Aunt   . Breast cancer Maternal Grandmother 24  . Cancer Maternal Aunt    Allergies: Allergies  Allergen Reactions  . Candesartan Other (See Comments)    headache  . Lisinopril Cough  . Tramadol Nausea And Vomiting   Medications: See med rec.  Review of Systems: No fevers, chills, night sweats, weight loss, chest pain, or shortness of breath.   Objective:    General: Well Developed, well nourished, and in no acute distress.  Neuro: Alert and oriented x3, extra-ocular muscles intact, sensation grossly intact.  HEENT: Normocephalic, atraumatic, pupils equal round reactive to light, neck supple, no masses, no lymphadenopathy, thyroid nonpalpable.  Oropharynx, nasopharynx unremarkable, bilateral cerumen impactions. Skin: Warm and dry, no rashes. Cardiac: Regular rate and rhythm, no murmurs rubs or gallops, no lower extremity edema.  Respiratory: Clear to auscultation bilaterally. Not using accessory muscles, speaking in full sentences.  Indication: Cerumen impaction of the left and right ear(s) Medical necessity statement: On  physical examination, cerumen impairs clinically significant portions of the external auditory canal, and tympanic membrane. Noted obstructive, copious cerumen that cannot be removed without magnification and instrumentations requiring physician skills Consent: Discussed benefits and risks of procedure and verbal consent obtained Procedure: Patient was prepped for the procedure. Utilized an otoscope to assess and take note of the ear canal, the tympanic membrane, and the presence, amount, and placement of the cerumen. Gentle water irrigation was utilized to remove cerumen.  Post procedure examination: shows cerumen was  completely removed. Patient tolerated procedure well. The patient is made aware that they may experience temporary vertigo, temporary hearing loss, and temporary discomfort. If these symptom last for more than 24 hours to call the clinic or proceed to the ED.  Impression and Recommendations:    Right ear pain Differential is broad and includes cerumen impaction which I see today, irrigation today. Her tympanic membrane did look slightly red, adding a course of Augmentin. If insufficient improvement we will consider differential diagnoses such as trigeminal neuralgia and TMJ dysfunction. Follow-up in 2 weeks for this.   ___________________________________________ Gwen Her. Dianah Field, M.D., ABFM., CAQSM. Primary Care and Sports Medicine [facility]  Adjunct Professor of [employer]

## 2019-04-29 NOTE — Assessment & Plan Note (Signed)
Differential is broad and includes cerumen impaction which I see today, irrigation today. Her tympanic membrane did look slightly red, adding a course of Augmentin. If insufficient improvement we will consider differential diagnoses such as trigeminal neuralgia and TMJ dysfunction. Follow-up in 2 weeks for this.

## 2019-05-06 ENCOUNTER — Other Ambulatory Visit: Payer: Self-pay | Admitting: Physician Assistant

## 2019-05-06 DIAGNOSIS — I10 Essential (primary) hypertension: Secondary | ICD-10-CM

## 2019-05-09 IMAGING — MG DIGITAL SCREENING BILATERAL MAMMOGRAM WITH TOMO AND CAD
6 of 10 series · 6 of 30 positions shown · non-contrast
Comparison: None.

CLINICAL DATA: Screening.

EXAM:
DIGITAL SCREENING BILATERAL MAMMOGRAM WITH TOMO AND CAD

[L MLO synth-2D]
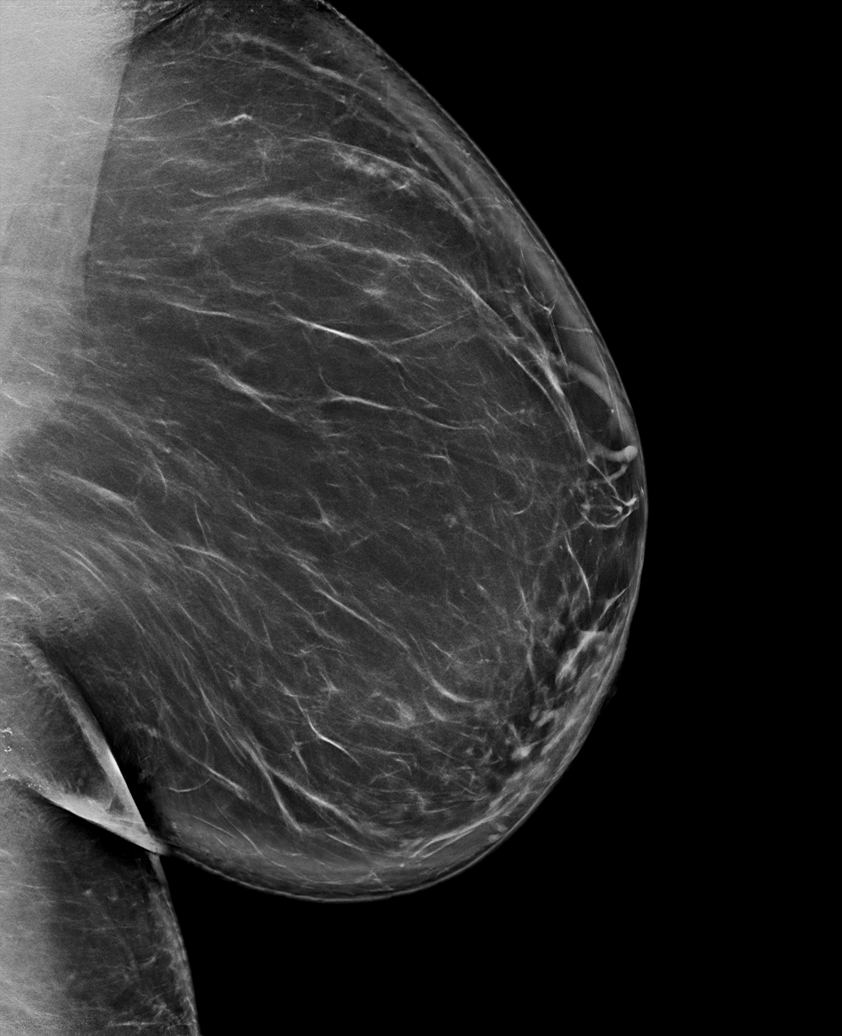

[R CC synth-2D]
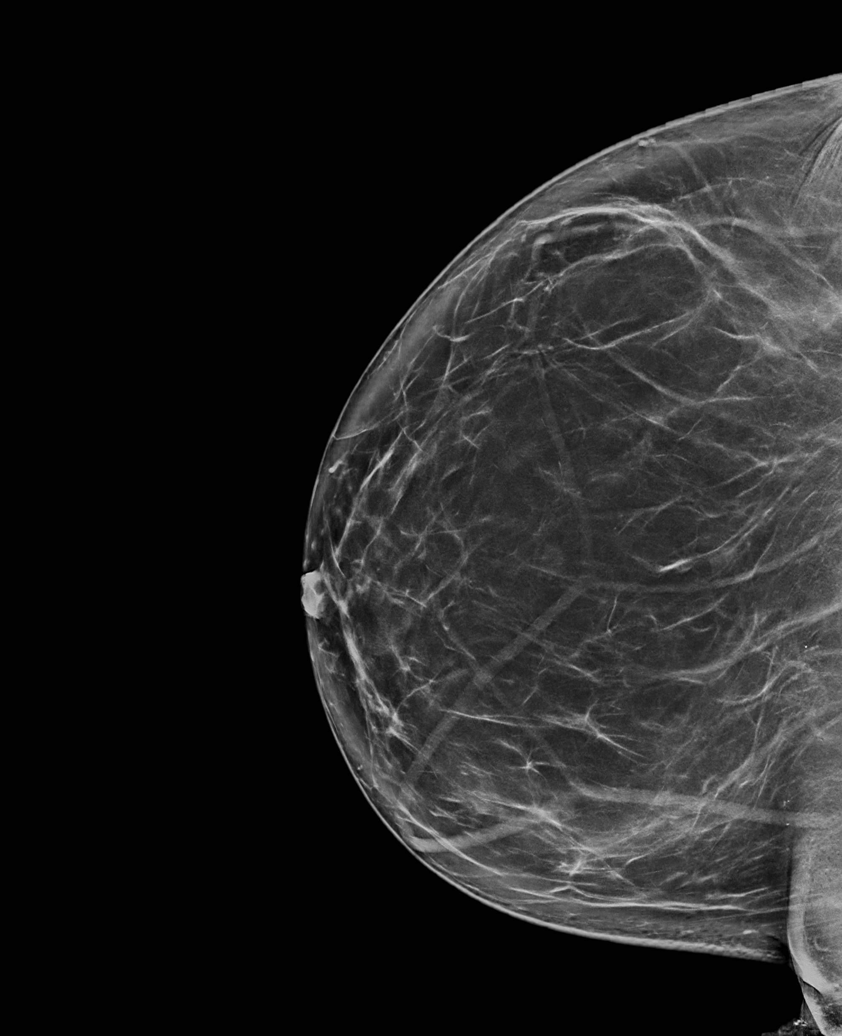

[L CC synth-2D]
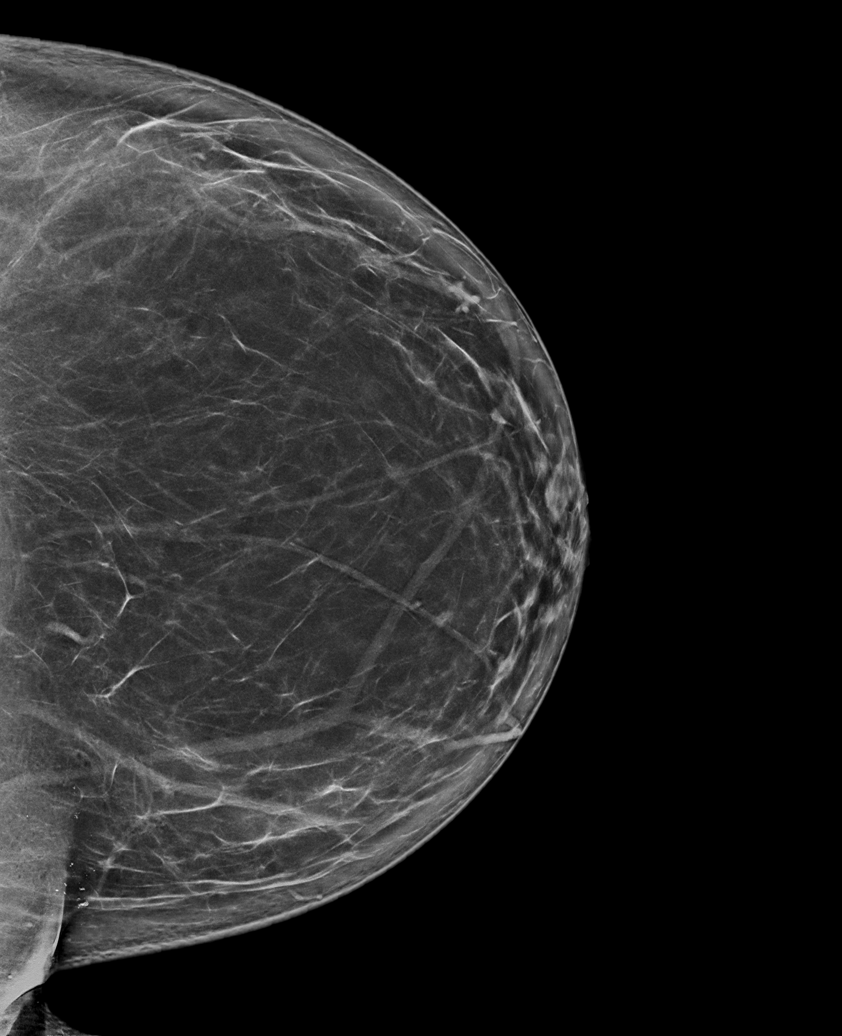

[R MLO synth-2D (1 of 2)]
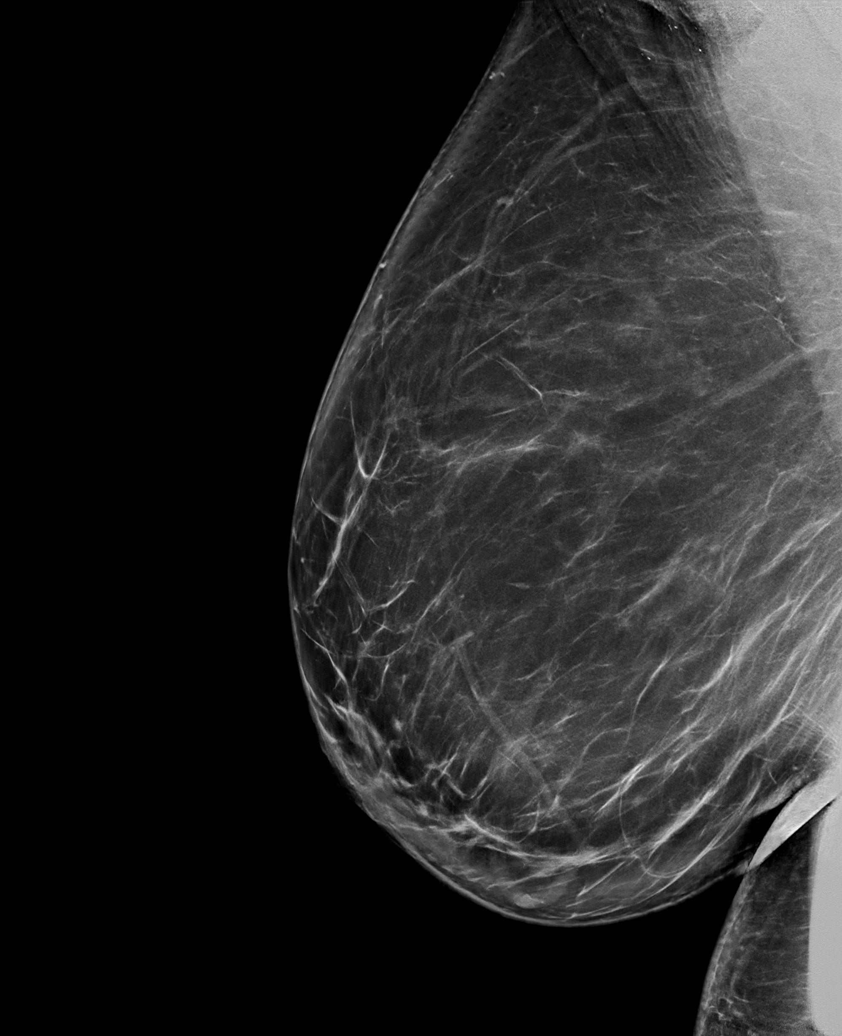

[R MLO synth-2D (2 of 2)]
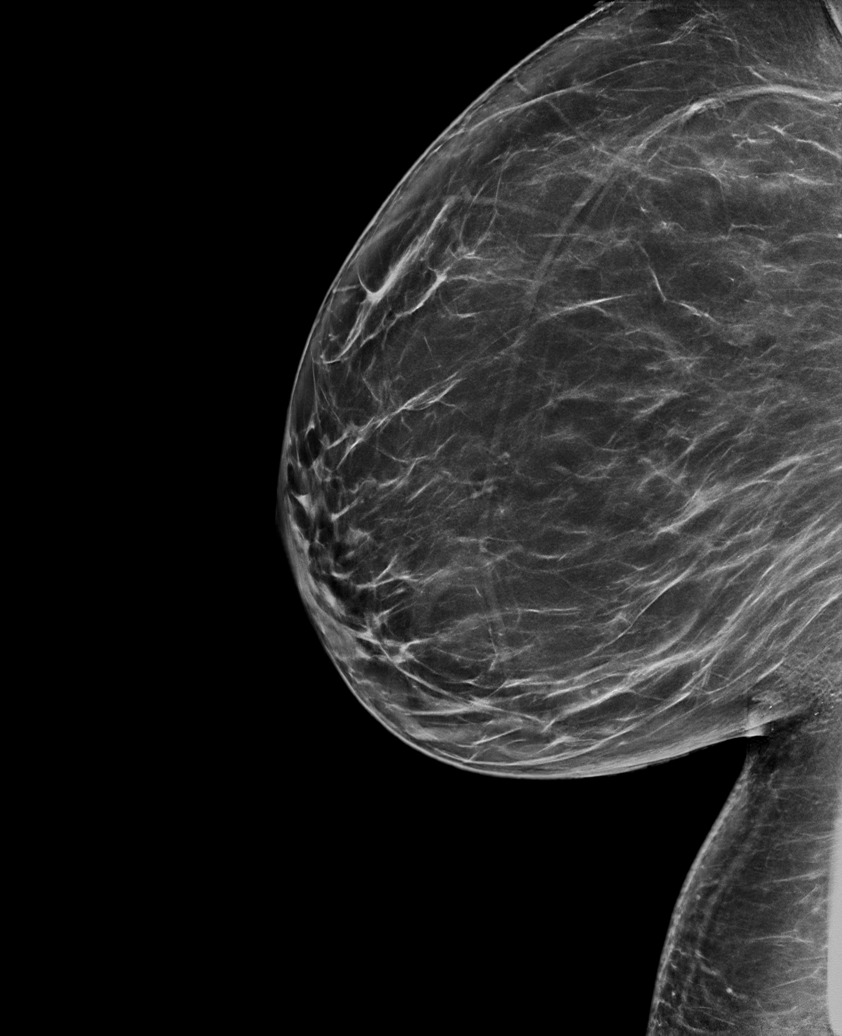

[R CC tomo · tomo slice 49/98.0]
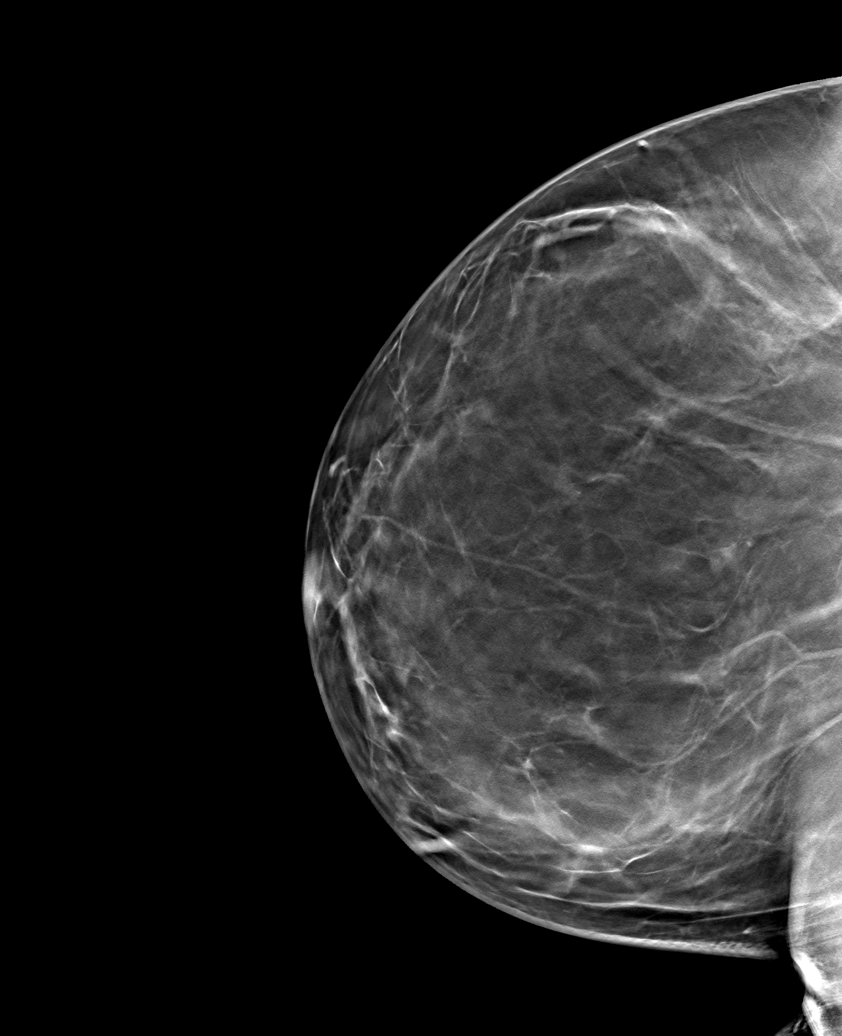

[6 of 30 positions shown; findings below may reference images not displayed]

ACR Breast Density Category b: There are scattered areas of
fibroglandular density.
FINDINGS: In the left breast, a possible mass warrants further evaluation. In
the right breast, no findings suspicious for malignancy.

Images were processed with CAD.
IMPRESSION: Further evaluation is suggested for possible mass in the left
breast.

RECOMMENDATION:
Diagnostic mammogram and possibly ultrasound of the left breast.
(Code:7W-B-[id number])

The patient will be contacted regarding the findings, and additional
imaging will be scheduled.

BI-RADS CATEGORY  0: Incomplete. Need additional imaging evaluation
and/or prior mammograms for comparison.

## 2019-05-11 ENCOUNTER — Ambulatory Visit: Payer: 59 | Admitting: Sports Medicine

## 2019-05-24 ENCOUNTER — Ambulatory Visit (INDEPENDENT_AMBULATORY_CARE_PROVIDER_SITE_OTHER): Payer: 59 | Admitting: Physician Assistant

## 2019-05-24 ENCOUNTER — Encounter: Payer: Self-pay | Admitting: Physician Assistant

## 2019-05-24 ENCOUNTER — Other Ambulatory Visit: Payer: Self-pay

## 2019-05-24 VITALS — BP 122/77 | HR 96 | Temp 98.2°F | Wt 201.0 lb

## 2019-05-24 DIAGNOSIS — L02412 Cutaneous abscess of left axilla: Secondary | ICD-10-CM | POA: Diagnosis not present

## 2019-05-24 MED ORDER — HYDROCODONE-ACETAMINOPHEN 5-325 MG PO TABS: 1.0000 | ORAL_TABLET | ORAL | 0 refills | Status: DC | PRN

## 2019-05-24 MED ORDER — NAPROXEN 500 MG PO TABS: 500.0000 mg | ORAL_TABLET | Freq: Two times a day (BID) | ORAL | 0 refills | Status: DC | PRN

## 2019-05-24 MED ORDER — DOXYCYCLINE HYCLATE 100 MG PO TABS: 100.0000 mg | ORAL_TABLET | Freq: Two times a day (BID) | ORAL | 0 refills | Status: AC

## 2019-05-24 NOTE — Progress Notes (Signed)
HPI:                                                                Hannah Lutz is a 38 y.o. female who presents to [facility] today for pain/swelling left axilla  Left axillary pain and what feels like "a boil" beginning on Saturday  Rapidly worsening Has been using warm compresses without improvement Denies fever, drainage, streaking redness Pain is moderate, persistent and worse with any movement of her left shoulder Reports history of abscess, but reports this is the worst one she has had  Past Medical History:  Diagnosis Date  . 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 Hypertension   . Tobacco use    Past Surgical History:  Procedure Laterality Date  . CERVICAL CERCLAGE     Social History   Tobacco Use  . Smoking status: Current Some Day Smoker    Types: Cigarettes  . Smokeless tobacco: Never Used  . Tobacco comment: 1-3 cigs/day  Substance Use Topics  . Alcohol use: Yes    Alcohol/week: 14.0 standard drinks    Types: 14 Glasses of wine per week   family history includes Breast cancer in her maternal aunt; Breast cancer (age of onset: 24) in her mother; Breast cancer (age of onset: 105) in her maternal grandmother; Cancer in her maternal aunt; Hypertension in her father.    ROS: negative except as noted in the HPI  Medications: Current Outpatient Medications  Medication Sig Dispense Refill  . amLODipine (NORVASC) 5 MG tablet Take 1 tablet (5 mg total) by mouth daily. 90 tablet 3  . diclofenac sodium (VOLTAREN) 1 % GEL Apply 4 g topically 4 (four) times daily. To affected joint. 100 g 11  . hydrochlorothiazide (HYDRODIURIL) 25 MG tablet Take 1 tablet (25 mg total) by mouth daily. 90 tablet 3   No current facility-administered medications for this visit.    Allergies  Allergen Reactions  . Candesartan Other (See Comments)    headache  . Lisinopril Cough  . Tramadol Nausea And Vomiting       Objective:  BP 122/77  (BP Location: Right Arm, Patient Position: Sitting, Cuff Size: Large)   Pulse 96   Temp 98.2 F (36.8 C) (Oral)   Wt 201 lb 0.6 oz (91.2 kg)   BMI 32.45 kg/m  Physical Exam Vitals signs reviewed.  Constitutional:      General: She is not in acute distress.    Appearance: Normal appearance. She is obese. She is not ill-appearing.  HENT:     Head: Normocephalic and atraumatic.  Pulmonary:     Effort: Pulmonary effort is normal.  Musculoskeletal:     Left shoulder: She exhibits decreased range of motion (secondary to pain).  Skin:    General: Skin is warm and dry.     Findings: Abscess (left axilla) present. No rash.       Neurological:     Mental Status: She is alert.  Psychiatric:        Behavior: Behavior normal. Behavior is cooperative.        Thought Content: Thought content normal.      Procedure:  Incision and drainage of abscess of left axilla Indication: infection, pain Verbal informed consent obtained. Surface cleaned with  chlorhexidine 7 cc lidocaine 1% with epinephine infiltrated around abscess in a field block. Adequate anesthesia ensured. Area prepped and draped in a sterile fashion. #11 blade used to make a stab incision into abscess. Pus expressed with pressure. Curved hemostat used to explore 4 quadrants and loculations broken up. Further purulence expressed. 3 inches of iodoform packing placed leaving a 1-inch tail. Hemostasis achieved. Patient tolerated procedure without immediate complications.  No results found for this or any previous visit (from the past 72 hour(s)). No results found.    Assessment and Plan: 38 y.o. female with   .Luke was seen today for abscess.  Diagnoses and all orders for this visit:  Cutaneous abscess of left axilla -     doxycycline (VIBRA-TABS) 100 MG tablet; Take 1 tablet (100 mg total) by mouth 2 (two) times daily for 7 days. -     Wound culture -     HYDROcodone-acetaminophen (NORCO) 5-325 MG tablet; Take  1 tablet by mouth every 4 (four) hours as needed for up to 3 days for moderate pain. -     naproxen (NAPROSYN) 500 MG tablet; Take 1 tablet (500 mg total) by mouth 2 (two) times daily as needed for up to 5 days for moderate pain.   I &D performed as above Doxycycline to cover for MRSA Wound cx pending Norco and Naproxen for pain control Counseled on wound care    Patient education and anticipatory guidance given Patient agrees with treatment plan Follow-up in 2 days for abscess check/packing removal or sooner as needed if symptoms worsen or fail to improve  Levonne Hubert PA-C

## 2019-05-24 NOTE — Patient Instructions (Signed)
Skin Abscess  A skin abscess is an infected area on or under your skin that contains a collection of pus and other material. An abscess may also be called a furuncle, carbuncle, or boil. An abscess can occur in or on almost any part of your body. Some abscesses break open (rupture) on their own. Most continue to get worse unless they are treated. The infection can spread deeper into the body and eventually into your blood, which can make you feel ill. Treatment usually involves draining the abscess. What are the causes? An abscess occurs when germs, like bacteria, pass through your skin and cause an infection. This may be caused by:  A scrape or cut on your skin.  A puncture wound through your skin, including a needle injection or insect bite.  Blocked oil or sweat glands.  Blocked and infected hair follicles.  A cyst that forms beneath your skin (sebaceous cyst) and becomes infected. What increases the risk? This condition is more likely to develop in people who:  Have a weak body defense system (immune system).  Have diabetes.  Have dry and irritated skin.  Get frequent injections or use illegal IV drugs.  Have a foreign body in a wound, such as a splinter.  Have problems with their lymph system or veins. What are the signs or symptoms? Symptoms of this condition include:  A painful, firm bump under the skin.  A bump with pus at the top. This may break through the skin and drain. Other symptoms include:  Redness surrounding the abscess site.  Warmth.  Swelling of the lymph nodes (glands) near the abscess.  Tenderness.  A sore on the skin. How is this diagnosed? This condition may be diagnosed based on:  A physical exam.  Your medical history.  A sample of pus. This may be used to find out what is causing the infection.  Blood tests.  Imaging tests, such as an ultrasound, CT scan, or MRI. How is this treated? A small abscess that drains on its own may not  need treatment. Treatment for larger abscesses may include:  Moist heat or heat pack applied to the area several times a day.  A procedure to drain the abscess (incision and drainage).  Antibiotic medicines. For a severe abscess, you may first get antibiotics through an IV and then change to antibiotics by mouth. Follow these instructions at home: Medicines   Take over-the-counter and prescription medicines only as told by your health care provider.  If you were prescribed an antibiotic medicine, take it as told by your health care provider. Do not stop taking the antibiotic even if you start to feel better. Abscess care   If you have an abscess that has not drained, apply heat to the affected area. Use the heat source that your health care provider recommends, such as a moist heat pack or a heating pad. ? Place a towel between your skin and the heat source. ? Leave the heat on for 20-30 minutes. ? Remove the heat if your skin turns bright red. This is especially important if you are unable to feel pain, heat, or cold. You may have a greater risk of getting burned.  Follow instructions from your health care provider about how to take care of your abscess. Make sure you: ? Cover the abscess with a bandage (dressing). ? Change your dressing or gauze as told by your health care provider. ? Wash your hands with soap and water before you change the   dressing or gauze. If soap and water are not available, use hand sanitizer.  Check your abscess every day for signs of a worsening infection. Check for: ? More redness, swelling, or pain. ? More fluid or blood. ? Warmth. ? More pus or a bad smell. General instructions  To avoid spreading the infection: ? Do not share personal care items, towels, or hot tubs with others. ? Avoid making skin contact with other people.  Keep all follow-up visits as told by your health care provider. This is important. Contact a health care provider if you  have:  More redness, swelling, or pain around your abscess.  More fluid or blood coming from your abscess.  Warm skin around your abscess.  More pus or a bad smell coming from your abscess.  A fever.  Muscle aches.  Chills or a general ill feeling. Get help right away if you:  Have severe pain.  See red streaks on your skin spreading away from the abscess. Summary  A skin abscess is an infected area on or under your skin that contains a collection of pus and other material.  A small abscess that drains on its own may not need treatment.  Treatment for larger abscesses may include having a procedure to drain the abscess and taking an antibiotic. This information is not intended to replace advice given to you by your health care provider. Make sure you discuss any questions you have with your health care provider. Document Released: 03/13/2005 Document Revised: 09/24/2018 Document Reviewed: 07/17/2017 Elsevier Patient Education  2020 Elsevier [employer]  

## 2019-05-26 ENCOUNTER — Other Ambulatory Visit: Payer: Self-pay | Admitting: Neurology

## 2019-05-26 ENCOUNTER — Other Ambulatory Visit: Payer: Self-pay

## 2019-05-26 ENCOUNTER — Ambulatory Visit (INDEPENDENT_AMBULATORY_CARE_PROVIDER_SITE_OTHER): Payer: 59 | Admitting: Sports Medicine

## 2019-05-26 DIAGNOSIS — L02412 Cutaneous abscess of left axilla: Secondary | ICD-10-CM | POA: Diagnosis not present

## 2019-05-26 HISTORY — DX: Cutaneous abscess of left axilla: L02.412

## 2019-05-26 MED ORDER — OXYCODONE-ACETAMINOPHEN 10-325 MG PO TABS: 1.0000 | ORAL_TABLET | ORAL | 0 refills | Status: DC | PRN

## 2019-05-26 MED ORDER — KETOROLAC TROMETHAMINE 60 MG/2ML IM SOLN
60.0000 mg | Freq: Once | INTRAMUSCULAR | Status: AC
  Administered 2019-05-26: 60 mg via INTRAMUSCULAR

## 2019-05-26 NOTE — Assessment & Plan Note (Signed)
I&D 2 days ago, culture still pending. Continue doxycycline, and significant pain today, not controlled by hydrocodone, Toradol 60 mg intramuscular, switching to high-dose oxycodone. Packing removed, redressed, return to see me next week.

## 2019-05-26 NOTE — Progress Notes (Signed)
Subjective:    CC: Follow-up  HPI: 2 days ago this pleasant 38 year old female had an incision and drainage of left axillary access, packed, she is in significant pain.  Cultures are still pending, she is on doxycycline, hydrocodone is not helping her pain.  I reviewed the past medical history, family history, social history, surgical history, and allergies today and no changes were needed.  Please see the problem list section below in epic for further details.  Past Medical History: Past Medical History:  Diagnosis Date  . 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 Hypertension   . Tobacco use    Past Surgical History: Past Surgical History:  Procedure Laterality Date  . CERVICAL CERCLAGE     Social History: Social History   Socioeconomic History  . Marital status: Single    Spouse name: Not on file  . Number of children: Not on file  . Years of education: Not on file  . Highest education level: Not on file  Occupational History  . Not on file  Social Needs  . Financial resource strain: Not on file  . Food insecurity    Worry: Not on file    Inability: Not on file  . Transportation needs    Medical: Not on file    Non-medical: Not on file  Tobacco Use  . Smoking status: Current Some Day Smoker    Types: Cigarettes  . Smokeless tobacco: Never Used  . Tobacco comment: 1-3 cigs/day  Substance and Sexual Activity  . Alcohol use: Yes    Alcohol/week: 14.0 standard drinks    Types: 14 Glasses of wine per week  . Drug use: No  . Sexual activity: Yes    Birth control/protection: None  Lifestyle  . Physical activity    Days per week: Not on file    Minutes per session: Not on file  . Stress: Not on file  Relationships  . Social Herbalist on phone: Not on file    Gets together: Not on file    Attends religious service: Not on file    Active member of club or organization: Not on file    Attends meetings of clubs or organizations: Not on file    Relationship  status: Not on file  Other Topics Concern  . Not on file  Social History Narrative  . Not on file   Family History: Family History  Problem Relation Age of Onset  . Breast cancer Mother 4  . Hypertension Father   . Breast cancer Maternal Aunt   . Breast cancer Maternal Grandmother 35  . Cancer Maternal Aunt    Allergies: Allergies  Allergen Reactions  . Candesartan Other (See Comments)    headache  . Lisinopril Cough  . Tramadol Nausea And Vomiting   Medications: See med rec.  Review of Systems: No fevers, chills, night sweats, weight loss, chest pain, or shortness of breath.   Objective:    General: Well Developed, well nourished, and in no acute distress.  Neuro: Alert and oriented x3, extra-ocular muscles intact, sensation grossly intact.  HEENT: Normocephalic, atraumatic, pupils equal round reactive to light, neck supple, no masses, no lymphadenopathy, thyroid nonpalpable.  Skin: Warm and dry, no rashes. Cardiac: Regular rate and rhythm, no murmurs rubs or gallops, no lower extremity edema.  Respiratory: Clear to auscultation bilaterally. Not using accessory muscles, speaking in full sentences. Left axilla: Minimally purulent, still swollen, packing was removed.  Redressed.  Toradol 60 given intramuscular.  Impression and Recommendations:  Abscess of axilla, left I&D 2 days ago, culture still pending. Continue doxycycline, and significant pain today, not controlled by hydrocodone, Toradol 60 mg intramuscular, switching to high-dose oxycodone. Packing removed, redressed, return to see me next week.   ___________________________________________ Ihor Austin. Benjamin Stain, M.D., ABFM., CAQSM. Primary Care and Sports Medicine [facility]  Adjunct Professor of Family Medicine  [employer]

## 2019-05-27 LAB — WOUND CULTURE
MICRO NUMBER:: 1170847
RESULT:: NO GROWTH
SPECIMEN QUALITY:: ADEQUATE

## 2019-06-01 ENCOUNTER — Telehealth: Payer: Self-pay

## 2019-06-01 DIAGNOSIS — L02412 Cutaneous abscess of left axilla: Secondary | ICD-10-CM

## 2019-06-01 NOTE — Telephone Encounter (Signed)
[facility] pharmacy requesting med refill for naproxen 500 mg. Rx not listed in active med list. Previous pt of Charley C. Pls advise, thanks.

## 2019-06-01 NOTE — Telephone Encounter (Signed)
Ok to refill 

## 2019-06-02 ENCOUNTER — Other Ambulatory Visit: Payer: Self-pay

## 2019-06-02 ENCOUNTER — Ambulatory Visit: Payer: 59 | Admitting: Sports Medicine

## 2019-06-02 MED ORDER — NAPROXEN 500 MG PO TABS: 500.0000 mg | ORAL_TABLET | Freq: Two times a day (BID) | ORAL | 0 refills | Status: DC | PRN

## 2019-06-02 NOTE — Telephone Encounter (Signed)
Unable to send rx. Not listed in active med list.

## 2019-06-08 MED ORDER — NAPROXEN 500 MG PO TABS: 500.0000 mg | ORAL_TABLET | Freq: Two times a day (BID) | ORAL | 0 refills | Status: AC | PRN

## 2019-06-08 NOTE — Telephone Encounter (Signed)
[facility] sent a fax request regarding if refill for naproxen will be approved. Unable to send rx, not listed in active med list. Pls send rx to the pharmacy. Thanks.

## 2019-06-08 NOTE — Addendum Note (Signed)
Addended by: Maryla Morrow on: 06/08/2019 03:53 PM   Modules accepted: Orders

## 2019-07-13 ENCOUNTER — Encounter: Payer: Self-pay | Admitting: Nurse Practitioner

## 2019-07-13 ENCOUNTER — Other Ambulatory Visit: Payer: Self-pay

## 2019-07-13 ENCOUNTER — Ambulatory Visit (INDEPENDENT_AMBULATORY_CARE_PROVIDER_SITE_OTHER): Payer: 59 | Admitting: Nurse Practitioner

## 2019-07-13 VITALS — BP 140/90 | HR 92 | Temp 98.4°F | Resp 12 | Ht 66.0 in | Wt 197.0 lb

## 2019-07-13 DIAGNOSIS — Z9189 Other specified personal risk factors, not elsewhere classified: Secondary | ICD-10-CM | POA: Diagnosis not present

## 2019-07-13 DIAGNOSIS — Z113 Encounter for screening for infections with a predominantly sexual mode of transmission: Secondary | ICD-10-CM

## 2019-07-13 DIAGNOSIS — N3001 Acute cystitis with hematuria: Secondary | ICD-10-CM | POA: Diagnosis not present

## 2019-07-13 LAB — POCT URINALYSIS DIP (CLINITEK)
Bilirubin, UA: NEGATIVE
Glucose, UA: NEGATIVE mg/dL
Ketones, POC UA: NEGATIVE mg/dL
Nitrite, UA: NEGATIVE
POC PROTEIN,UA: 100 — AB
Spec Grav, UA: >=1.03 — AB (ref 1.010–1.025)
Urobilinogen, UA: 0.2 E.U./dL
pH, UA: 6.5 (ref 5.0–8.0)

## 2019-07-13 MED ORDER — METRONIDAZOLE 500 MG PO TABS: 500.0000 mg | ORAL_TABLET | Freq: Two times a day (BID) | ORAL | 0 refills | Status: DC

## 2019-07-13 MED ORDER — AZITHROMYCIN 500 MG PO TABS: 1000.0000 mg | ORAL_TABLET | Freq: Once | ORAL | 0 refills | Status: AC

## 2019-07-13 MED ORDER — CEFTRIAXONE SODIUM 500 MG IJ SOLR: 500.0000 mg | Freq: Once | INTRAMUSCULAR | Status: DC

## 2019-07-13 NOTE — Progress Notes (Signed)
Acute Office Visit  Subjective:    Patient ID: Hannah Lutz, female    DOB: Apr 06, 1981, 39 y.o.   MRN: [id number]  CC: STI screening  HPI Patient is in today for STI testing. Patient reports she recently discovered her partner was sexually involved with others for an unknown period of time. She had been having unprotected vaginal intercourse with him during this time. She started to experience vaginal itching and burning, as well as milky malodorous discharge appx 4 days ago and is concerned for STI. She does not know if he is having any symptoms or has been treated for STI recently.   STD SCREENING Sexual activity:  Recent unprotected sexual encounter Contraception: no Recent unprotected intercourse: yes History of sexually transmitted diseases: yes Previous sexually transmitted disease screening: yes Genital lesions: no Genital discharge: yes Dysuria: yes Swollen lymph nodes: no Fevers: no Rash: no   GYN/Sexual Health  Menstrual status: having periods  LMP: 06/27/2019  Menses: regular   Last pap smear: 2019  Sexually active: yes  Current contraception:none   New Sexual Partners:no  History of STI: yes   Past Medical History:  Diagnosis Date  . 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 Hypertension   . Tobacco use     Past Surgical History:  Procedure Laterality Date  . CERVICAL CERCLAGE      Family History  Problem Relation Age of Onset  . Breast cancer Mother 53  . Hypertension Father   . Breast cancer Maternal Aunt   . Breast cancer Maternal Grandmother 1  . Cancer Maternal Aunt     Social History   Socioeconomic History  . Marital status: Single    Spouse name: Not on file  . Number of children: Not on file  . Years of education: Not on file  . Highest education level: Not on file  Occupational History  . Not on file  Tobacco Use  . Smoking status: Current Some Day Smoker    Types: Cigarettes  . Smokeless tobacco: Never Used  . Tobacco comment:  1-3 cigs/day  Substance and Sexual Activity  . Alcohol use: Yes    Alcohol/week: 14.0 standard drinks    Types: 14 Glasses of wine per week  . Drug use: No  . Sexual activity: Yes    Birth control/protection: None  Other Topics Concern  . Not on file  Social History Narrative  . Not on file   Social Determinants of Health   Financial Resource Strain:   . Difficulty of Paying Living Expenses: Not on file  Food Insecurity:   . Worried About Programme researcher, broadcasting/film/video in the Last Year: Not on file  . Ran Out of Food in the Last Year: Not on file  Transportation Needs:   . Lack of Transportation (Medical): Not on file  . Lack of Transportation (Non-Medical): Not on file  Physical Activity:   . Days of Exercise per Week: Not on file  . Minutes of Exercise per Session: Not on file  Stress:   . Feeling of Stress : Not on file  Social Connections:   . Frequency of Communication with Friends and Family: Not on file  . Frequency of Social Gatherings with Friends and Family: Not on file  . Attends Religious Services: Not on file  . Active Member of Clubs or Organizations: Not on file  . Attends Banker Meetings: Not on file  . Marital Status: Not on file  Intimate Partner Violence:   . Fear of Current  or Ex-Partner: Not on file  . Emotionally Abused: Not on file  . Physically Abused: Not on file  . Sexually Abused: Not on file    Outpatient Medications Prior to Visit  Medication Sig Dispense Refill  . amLODipine (NORVASC) 5 MG tablet Take 1 tablet (5 mg total) by mouth daily. 90 tablet 3  . diclofenac sodium (VOLTAREN) 1 % GEL Apply 4 g topically 4 (four) times daily. To affected joint. 100 g 11  . hydrochlorothiazide (HYDRODIURIL) 25 MG tablet Take 1 tablet (25 mg total) by mouth daily. 90 tablet 3  . oxyCODONE-acetaminophen (PERCOCET) 10-325 MG tablet Take 1 tablet by mouth every 4 (four) hours as needed for pain. 30 tablet 0   No facility-administered medications prior  to visit.    Allergies  Allergen Reactions  . Candesartan Other (See Comments)    headache  . Lisinopril Cough  . Tramadol Nausea And Vomiting    Review of Systems  Constitutional: Negative for chills, fatigue and fever.  Gastrointestinal: Negative for abdominal distention, abdominal pain, diarrhea, nausea and vomiting.  Genitourinary: Positive for dysuria, frequency, urgency, vaginal discharge and vaginal pain. Negative for difficulty urinating, flank pain, genital sores, hematuria, menstrual problem and pelvic pain.       Objective:    Physical Exam Vitals and nursing note reviewed. Exam conducted with a chaperone present.  Constitutional:      Appearance: Normal appearance.  Cardiovascular:     Rate and Rhythm: Normal rate and regular rhythm.     Pulses: Normal pulses.     Heart sounds: Normal heart sounds.  Pulmonary:     Effort: Pulmonary effort is normal.     Breath sounds: Normal breath sounds.  Abdominal:     General: Bowel sounds are normal. There is no distension.     Palpations: Abdomen is soft.     Tenderness: There is no abdominal tenderness. There is no right CVA tenderness, left CVA tenderness or guarding.  Genitourinary:    General: Normal vulva.     Exam position: Lithotomy position.     Pubic Area: No rash.      Labia:        Right: No rash, tenderness or lesion.        Left: No rash, tenderness or lesion.      Vagina: Vaginal discharge present.     Cervix: Discharge, friability and erythema present. No cervical motion tenderness.     Uterus: Normal. Not tender.      Adnexa: Right adnexa normal and left adnexa normal.       Right: No tenderness or fullness.         Left: No tenderness or fullness.       Rectum: Normal.  Lymphadenopathy:     Lower Body: No right inguinal adenopathy. No left inguinal adenopathy.  Skin:    General: Skin is warm and dry.     Capillary Refill: Capillary refill takes less than 2 seconds.  Neurological:     General:  No focal deficit present.     Mental Status: She is alert and oriented to person, place, and time.  Psychiatric:        Mood and Affect: Mood normal.        Behavior: Behavior normal.     There were no vitals taken for this visit. Wt Readings from Last 3 Encounters:  05/24/19 201 lb 0.6 oz (91.2 kg)  04/29/19 198 lb (89.8 kg)  02/17/19 205 lb (93 kg)  There are no preventive care reminders to display for this patient.  There are no preventive care reminders to display for this patient.   No results found for: TSH Lab Results  Component Value Date   WBC 8.9 01/14/2019   HGB 12.6 01/14/2019   HCT 39.3 01/14/2019   MCV 78.0 (L) 01/14/2019   PLT 349 01/14/2019   Lab Results  Component Value Date   NA 138 01/14/2019   K 3.8 01/14/2019   CO2 26 01/14/2019   GLUCOSE 94 01/14/2019   BUN 12 01/14/2019   CREATININE 0.88 01/14/2019   BILITOT 0.2 01/14/2019   ALKPHOS 71 10/18/2016   AST 16 01/14/2019   ALT 17 01/14/2019   PROT 7.7 01/14/2019   ALBUMIN 3.8 10/18/2016   CALCIUM 9.6 01/14/2019   Lab Results  Component Value Date   CHOL 187 01/14/2019   Lab Results  Component Value Date   HDL 52 01/14/2019   Lab Results  Component Value Date   LDLCALC 112 (H) 01/14/2019   Lab Results  Component Value Date   TRIG 122 01/14/2019   Lab Results  Component Value Date   CHOLHDL 3.6 01/14/2019   Lab Results  Component Value Date   HGBA1C 5.2 10/18/2016       Assessment & Plan:   1. Acute cystitis with hematuria Urinalysis and culture sent today.  Symptoms and presentation most correlate with possible STI. Rocephin injection provided in the office, azithromycin prescription for 1 time dose of 1000 mg, and 7-day course of metronidazole sent to pharmacy for the suspicion of STI. Based on results of UA and culture further antibiotic therapy may be required. Discussed with the patient to avoid sexual intercourse for 7 days and monitor for symptoms of lower abdominal  tenderness, dysuria, flank pain, fever, chills. We will make changes to plan as needed based on lab results. Patient to follow-up if symptoms worsen or fail to improve.  2. Screening for STD (sexually transmitted disease) Vaginal exam performed and wet prep sent to lab.  History, symptoms, and presentation correlate with potential STI.  Patient prophylactically treated for trichomonas, gonorrhea, and chlamydia. Discussed with patient to avoid sexual intercourse for at least 7 days.  Also discussed that her previous partner may need to be notified and treated based on the results of the testing.  Patient was educated to not drink alcohol while taking metronidazole.  Safe sex practices encouraged.  Declined HIV testing today. Patient to return if symptoms worsen or fail to improve.  3. At risk for sexually transmitted disease due to partner with multiple partners Patient at increased risk of STI transmission due to unprotected sexual intercourse with a partner with other sexual partners. STI and urine testing performed today.  Patient declined HIV testing. Encourage safe sex practices.  Discussed with patient that based on results of testing her previous partner may require treatment as well. Patient to return if symptoms worsen or fail to improve.  - POCT URINALYSIS DIP (CLINITEK) - Urine Culture - WET PREP FOR TRICH, YEAST, CLUE - azithromycin (ZITHROMAX) 500 MG tablet; Take 2 tablets (1,000 mg total) by mouth once for 1 dose.  Dispense: 2 tablet; Refill: 0 - cefTRIAXone (ROCEPHIN) injection 500 mg - metroNIDAZOLE (FLAGYL) 500 MG tablet; Take 1 tablet (500 mg total) by mouth 2 (two) times daily.  Dispense: 14 tablet; Refill: 0  Return if symptoms worsen or fail to improve.  Orma Render, NP

## 2019-07-13 NOTE — Patient Instructions (Addendum)
Do not drink alcohol while taking metronidazole.  Trichomoniasis Trichomoniasis is an STI (sexually transmitted infection) that can affect both women and men. In women, the outer area of the female genitalia (vulva) and the vagina are affected. In men, mainly the penis is affected, but the prostate and other reproductive organs can also be involved.  This condition can be treated with medicine. It often has no symptoms (is asymptomatic), especially in men. If not treated, trichomoniasis can last for months or years. What are the causes? This condition is caused by a parasite called Trichomonas vaginalis. Trichomoniasis most often spreads from person to person (is contagious) through sexual contact. What increases the risk? The following factors may make you more likely to develop this condition:  Having unprotected sex.  Having sex with a partner who has trichomoniasis.  Having multiple sexual partners.  Having had previous trichomoniasis infections or other STIs. What are the signs or symptoms? In women, symptoms of trichomoniasis include:  Abnormal vaginal discharge that is clear, white, gray, or yellow-green and foamy and has an unusual "fishy" odor.  Itching and irritation of the vagina and vulva.  Burning or pain during urination or sex.  Redness and swelling of the genitals. In men, symptoms of trichomoniasis include:  Penile discharge that may be foamy or contain pus.  Pain in the penis. This may happen only when urinating.  Itching or irritation inside the penis.  Burning after urination or ejaculation. How is this diagnosed? In women, this condition may be found during a routine Pap test or physical exam. It may be found in men during a routine physical exam. Your health care provider may do tests to help diagnose this infection, such as:  Urine tests (men and women).  The following in women: ? Testing the pH of the vagina. ? A vaginal swab test that checks for the  Trichomonas vaginalis parasite. ? Testing vaginal secretions. Your health care provider may test you for other STIs, including HIV (human immunodeficiency virus). How is this treated? This condition is treated with medicine taken by mouth (orally), such as metronidazole or tinidazole, to fight the infection. Your sexual partner(s) also need to be tested and treated.  If you are a woman and you plan to become pregnant or think you may be pregnant, tell your health care provider right away. Some medicines that are used to treat the infection should not be taken during pregnancy. Your health care provider may recommend over-the-counter medicines or creams to help relieve itching or irritation. You may be tested for infection again 3 months after treatment. Follow these instructions at home:  Take and use over-the-counter and prescription medicines, including creams, only as told by your health care provider.  Take your antibiotic medicine as told by your health care provider. Do not stop taking the antibiotic even if you start to feel better.  Do not have sex until 7-10 days after you finish your medicine, or until your health care provider approves. Ask your health care provider when you may start to have sex again.  (Women) Do not douche or wear tampons while you have the infection.  Discuss your infection with your sexual partner(s). Make sure that your partner gets tested and treated, if necessary.  Keep all follow-up visits as told by your health care provider. This is important. How is this prevented?   Use condoms every time you have sex. Using condoms correctly and consistently can help protect against STIs.  Avoid having multiple sexual partners.  Talk with your sexual partner about any symptoms that either of you may have, as well as any history of STIs.  Get tested for STIs and STDs (sexually transmitted diseases) before you have sex. Ask your partner to do the same.  Do not  have sexual contact if you have symptoms of trichomoniasis or another STI. Contact a health care provider if:  You still have symptoms after you finish your medicine.  You develop pain in your abdomen.  You have pain when you urinate.  You have bleeding after sex.  You develop a rash.  You feel nauseous or you vomit.  You plan to become pregnant or think you may be pregnant. Summary  Trichomoniasis is an STI (sexually transmitted infection) that can affect both women and men.  This condition often has no symptoms (is asymptomatic), especially in men.  Without treatment, this condition can last for months or years.  You should not have sex until 7-10 days after you finish your medicine, or until your health care provider approves. Ask your health care provider when you may start to have sex again.  Discuss your infection with your sexual partner(s). Make sure that your partner gets tested and treated, if necessary. This information is not intended to replace advice given to you by your health care provider. Make sure you discuss any questions you have with your health care provider. Document Revised: 03/17/2018 Document Reviewed: 03/17/2018 Elsevier Patient Education  2020 [employer]. Gonorrhea Gonorrhea is a sexually transmitted disease (STD) that can affect both men and women. If left untreated, this infection can:  Damage the female or female organs.  Cause women and men to be unable to have children (be sterile).  Harm a fetus if an infected woman is pregnant. It is important to get treatment for gonorrhea as soon as possible. It is also necessary for all of your sexual partners to be tested for the infection. What are the causes? This condition is caused by bacteria called Neisseria gonorrhoeae. The infection is spread from person to person through sexual contact, including oral, anal, and vaginal sex. A newborn can contract the infection from his or her mother during  birth. What increases the risk? The following factors may make you more likely to develop this condition:  Being a woman who is younger than 41 years of age and who is sexually active.  Being a woman 51 years of age or older who has: ? A new sex partner. ? More than one sex partner. ? A sex partner who has an STD.  Being a man who has: ? A new sex partner. ? More than one sex partner. ? A sex partner who has an STD.  Using condoms inconsistently.  Currently having, or having previously had, an STD.  Exchanging sex for money or drugs. What are the signs or symptoms? Some people do not have any symptoms. If you do have symptoms, they may be different for females and males. For females  Pain in the lower abdomen.  Abnormal vaginal discharge. The discharge may be cloudy, thick, or yellow-green in color.  Bleeding between periods.  Painful sex.  Burning or itching in and around the vagina.  Pain or burning when urinating.  Irritation, pain, bleeding, or discharge from the rectum. This may occur if the infection was spread by anal sex.  Sore throat or swollen lymph nodes in the neck. This may occur if the infection was spread by oral sex. For males  Abnormal discharge  from the penis. This discharge may be cloudy, thick, or yellow-green in color.  Pain or burning during urination.  Pain or swelling in the testicles.  Irritation, pain, bleeding, or discharge from the rectum. This may occur if the infection was spread by anal sex.  Sore throat, fever, or swollen lymph nodes in the neck. This may occur if the infection was spread by oral sex. How is this diagnosed? This condition is diagnosed based on:  A physical exam.  A sample of discharge that is examined under a microscope to look for the bacteria. The discharge may be taken from the urethra, cervix, throat, or rectum.  Urine tests. Not all of test results will be available during your visit. How is this  treated? This condition is treated with antibiotic medicines. It is important for treatment to begin as soon as possible. Yulisa Chirico treatment may prevent some problems from developing. Do not have sex during treatment. Avoid all types of sexual activity for 7 days after treatment is complete and until any sex partners have been treated. Follow these instructions at home:  Take over-the-counter and prescription medicines only as told by your health care provider.  Take your antibiotic medicine as told by your health care provider. Do not stop taking the antibiotic even if you start to feel better.  Do not have sex until at least 7 days after you and your partner(s) have finished treatment and your health care provider says it is okay.  It is your responsibility to get your test results. Ask your health care provider, or the department performing the test, when your results will be ready.  If you test positive for gonorrhea, inform your recent sexual partners. This includes any oral, anal, or vaginal sex partners. They need to be checked for gonorrhea even if they do not have symptoms. They may need treatment, even if they test negative for gonorrhea.  Keep all follow-up visits as told by your health care provider. This is important. How is this prevented?   Use latex condoms correctly every time you have sexual intercourse.  Ask if your sexual partner has been tested for STDs and had negative results.  Avoid having multiple sexual partners. Contact a health care provider if:  You develop a bad reaction to the medicine you were prescribed. This may include: ? A rash. ? Nausea. ? Vomiting. ? Diarrhea.  Your symptoms do not get better after a few days of taking antibiotics.  Your symptoms get worse.  You develop new symptoms.  Your pain gets worse.  You have a fever.  You develop pain, itching, or discharge around the eyes. Get help right away if:  You feel dizzy or faint.  You  have trouble breathing or have shortness of breath.  You develop an irregular heartbeat.  You have severe abdominal pain with or without shoulder pain.  You develop any bumps or sores (lesions) on your skin.  You develop warmth, redness, pain, or swelling around your joints, such as the knee. Summary  Gonorrhea is an STD that can affect both men and women.  This condition is caused by bacteria called Neisseria gonorrhoeae. The infection is spread from person to person, usually through sexual contact, including oral, anal, and vaginal sex.  Symptoms vary between males and females. Generally, they include abnormal discharge and burning during urination. Women may also experience painful sex, itching around the vagina, and bleeding between menstrual periods. Men may also experience swelling of the testicles.  This condition  is treated with antibiotic medicines. Do not have sex until at least 7 days after completing antibiotic treatment.  If left untreated, gonorrhea can have serious side effects and complications. This information is not intended to replace advice given to you by your health care provider. Make sure you discuss any questions you have with your health care provider. Document Revised: 10/27/2018 Document Reviewed: 05/03/2016 Elsevier Patient Education  2020 [employer] Chlamydia, Female  Chlamydia is a STD (sexually transmitted disease). This is an infection that spreads through sexual contact. If it is not treated, it can cause serious problems. It must be treated with antibiotic medicine. If this infection is not treated and you are pregnant or become pregnant, your baby could get it during delivery. This may cause bad health problems for the baby. Sometimes, you may not have symptoms (asymptomatic). When you have symptoms, they can include:  Burning when you pee (urinate).  Peeing often.  Fluid (discharge) coming from the vagina.  Redness, soreness, and swelling  (inflammation) of the butt (rectum).  Bleeding or fluid coming from the butt.  Belly (abdominal) pain.  Pain during sex.  Bleeding between periods.  Itching, burning, or redness in the eyes.  Fluid coming from the eyes. Follow these instructions at home: Medicines  Take over-the-counter and prescription medicines only as told by your doctor.  Take your antibiotic medicine as told by your doctor. Do not stop taking the antibiotic even if you start to feel better. Sexual activity  Tell sex partners about your infection. Sex partners are people you had oral, anal, or vaginal sex with within 60 days of when you started getting sick. They need treatment, too.  Do not have sex until: ? You and your sex partners have been treated. ? Your doctor says it is okay.  If you have a single dose treatment, wait 7 days before having sex. General instructions  It is up to you to get your test results. Ask your doctor when your results will be ready.  Get a lot of rest.  Eat healthy foods.  Drink enough fluid to keep your pee (urine) clear or pale yellow.  Keep all follow-up visits as told by your doctor. You may need tests after 3 months. Preventing chlamydia  The only way to prevent chlamydia is not to have sex. To lower your risk: ? Use latex condoms correctly. Do this every time you have sex. ? Avoid having many sex partners. ? Ask if your partner has been tested for STDs and if he or she had negative results. Contact a doctor if:  You get new symptoms.  You do not get better with treatment.  You have a fever or chills.  You have pain during sex. Get help right away if:  Your pain gets worse and does not get better with medicine.  You get flu-like symptoms, such as: ? Night sweats. ? Sore throat. ? Muscle aches.  You feel sick to your stomach (nauseous).  You throw up (vomit).  You have trouble swallowing.  You have bleeding: ? Between periods. ? After  sex.  You have irregular periods.  You have belly pain that does not get better with medicine.  You have lower back pain that does not get better with medicine.  You feel weak or dizzy.  You pass out (faint).  You are pregnant and you get symptoms of chlamydia. Summary  Chlamydia is an infection that spreads through sexual contact.  Sometimes, chlamydia can cause no symptoms (  asymptomatic).  Do not have sex until your doctor says it is okay.  All sex partners will have to be treated for chlamydia. This information is not intended to replace advice given to you by your health care provider. Make sure you discuss any questions you have with your health care provider. Document Revised: 11/25/2017 Document Reviewed: 05/23/2016 Elsevier Patient Education  2020 [employer].

## 2019-07-14 ENCOUNTER — Other Ambulatory Visit: Payer: Self-pay | Admitting: Nurse Practitioner

## 2019-07-14 LAB — TIQ-NTM

## 2019-07-16 ENCOUNTER — Telehealth: Payer: Self-pay

## 2019-07-16 LAB — WET PREP FOR TRICH, YEAST, CLUE
MICRO NUMBER:: 10096133
Specimen Quality: ADEQUATE

## 2019-07-16 LAB — TEST AUTHORIZATION: TEST CODE:: 14577

## 2019-07-16 LAB — URINE CULTURE
MICRO NUMBER:: 10082249
SPECIMEN QUALITY:: ADEQUATE

## 2019-07-16 LAB — C. TRACHOMATIS/N. GONORRHOEAE RNA

## 2019-07-16 NOTE — Telephone Encounter (Signed)
Patient advised - see result note.

## 2019-07-16 NOTE — Telephone Encounter (Signed)
[facility] called requesting lab results. I advised they have not been resulted and we would call when they get resulted. I did call [facility] and they state the C. trachomatis/N. gonorrhoeae RNA will not be ran. They will call us back about the Wet Prep.

## 2019-07-16 NOTE — Telephone Encounter (Signed)
Results received for recent tests that were ran by the lab. It is unclear why the gonorrhea and chlamydia tests were cancelled. A result note has been created. Patient has been treated for both gonorrhea and chlamydia, therefore test results not necessary, but would be nice. The patient may come in to give a urine sample and we will run the test if she would like. The patient was positive for BV, but has been treated for that, as well.

## 2019-09-30 ENCOUNTER — Encounter: Payer: Self-pay | Admitting: Nurse Practitioner

## 2019-09-30 ENCOUNTER — Ambulatory Visit (INDEPENDENT_AMBULATORY_CARE_PROVIDER_SITE_OTHER): Payer: 59 | Admitting: Nurse Practitioner

## 2019-09-30 ENCOUNTER — Other Ambulatory Visit: Payer: Self-pay

## 2019-09-30 VITALS — BP 146/95 | HR 90 | Ht 66.0 in | Wt 194.0 lb

## 2019-09-30 DIAGNOSIS — N898 Other specified noninflammatory disorders of vagina: Secondary | ICD-10-CM | POA: Diagnosis not present

## 2019-09-30 DIAGNOSIS — G8929 Other chronic pain: Secondary | ICD-10-CM

## 2019-09-30 DIAGNOSIS — I10 Essential (primary) hypertension: Secondary | ICD-10-CM | POA: Diagnosis not present

## 2019-09-30 DIAGNOSIS — M25562 Pain in left knee: Secondary | ICD-10-CM

## 2019-09-30 DIAGNOSIS — M25561 Pain in right knee: Secondary | ICD-10-CM | POA: Diagnosis not present

## 2019-09-30 DIAGNOSIS — R300 Dysuria: Secondary | ICD-10-CM | POA: Diagnosis not present

## 2019-09-30 LAB — POCT URINALYSIS DIP (CLINITEK)
Bilirubin, UA: NEGATIVE
Glucose, UA: NEGATIVE mg/dL
Ketones, POC UA: NEGATIVE mg/dL
Nitrite, UA: NEGATIVE
POC PROTEIN,UA: 30 — AB
Spec Grav, UA: 1.02 (ref 1.010–1.025)
Urobilinogen, UA: 0.2 E.U./dL
pH, UA: 7 (ref 5.0–8.0)

## 2019-09-30 MED ORDER — HYDROCHLOROTHIAZIDE 25 MG PO TABS: 25.0000 mg | ORAL_TABLET | Freq: Every day | ORAL | 3 refills | Status: DC

## 2019-09-30 MED ORDER — DICLOFENAC SODIUM 1 % EX GEL: 2.0000 g | Freq: Four times a day (QID) | CUTANEOUS | 11 refills | Status: DC

## 2019-09-30 MED ORDER — METRONIDAZOLE 0.75 % EX GEL: 1.0000 "application " | Freq: Two times a day (BID) | CUTANEOUS | 3 refills | Status: DC

## 2019-09-30 MED ORDER — AMLODIPINE BESYLATE 5 MG PO TABS: 5.0000 mg | ORAL_TABLET | Freq: Every day | ORAL | 3 refills | Status: DC

## 2019-09-30 NOTE — Patient Instructions (Signed)
Bacterial Vaginosis  Bacterial vaginosis is a vaginal infection that occurs when the normal balance of bacteria in the vagina is disrupted. It results from an overgrowth of certain bacteria. This is the most common vaginal infection among women ages 15-44. Because bacterial vaginosis increases your risk for STIs (sexually transmitted infections), getting treated can help reduce your risk for chlamydia, gonorrhea, herpes, and HIV (human immunodeficiency virus). Treatment is also important for preventing complications in pregnant women, because this condition can cause an Hannah Lutz (premature) delivery. What are the causes? This condition is caused by an increase in harmful bacteria that are normally present in small amounts in the vagina. However, the reason that the condition develops is not fully understood. What increases the risk? The following factors may make you more likely to develop this condition:  Having a new sexual partner or multiple sexual partners.  Having unprotected sex.  Douching.  Having an intrauterine device (IUD).  Smoking.  Drug and alcohol abuse.  Taking certain antibiotic medicines.  Being pregnant. You cannot get bacterial vaginosis from toilet seats, bedding, swimming pools, or contact with objects around you. What are the signs or symptoms? Symptoms of this condition include:  Grey or white vaginal discharge. The discharge can also be watery or foamy.  A fish-like odor with discharge, especially after sexual intercourse or during menstruation.  Itching in and around the vagina.  Burning or pain with urination. Some women with bacterial vaginosis have no signs or symptoms. How is this diagnosed? This condition is diagnosed based on:  Your medical history.  A physical exam of the vagina.  Testing a sample of vaginal fluid under a microscope to look for a large amount of bad bacteria or abnormal cells. Your health care provider may use a cotton swab or  a small wooden spatula to collect the sample. How is this treated? This condition is treated with antibiotics. These may be given as a pill, a vaginal cream, or a medicine that is put into the vagina (suppository). If the condition comes back after treatment, a second round of antibiotics may be needed. Follow these instructions at home: Medicines  Take over-the-counter and prescription medicines only as told by your health care provider.  Take or use your antibiotic as told by your health care provider. Do not stop taking or using the antibiotic even if you start to feel better. General instructions  If you have a female sexual partner, tell her that you have a vaginal infection. She should see her health care provider and be treated if she has symptoms. If you have a female sexual partner, he does not need treatment.  During treatment: ? Avoid sexual activity until you finish treatment. ? Do not douche. ? Avoid alcohol as directed by your health care provider. ? Avoid breastfeeding as directed by your health care provider.  Drink enough water and fluids to keep your urine clear or pale yellow.  Keep the area around your vagina and rectum clean. ? Wash the area daily with warm water. ? Wipe yourself from front to back after using the toilet.  Keep all follow-up visits as told by your health care provider. This is important. How is this prevented?  Do not douche.  Wash the outside of your vagina with warm water only.  Use protection when having sex. This includes latex condoms and dental dams.  Limit how many sexual partners you have. To help prevent bacterial vaginosis, it is best to have sex with just one partner (  monogamous).  Make sure you and your sexual partner are tested for STIs.  Wear cotton or cotton-lined underwear.  Avoid wearing tight pants and pantyhose, especially during summer.  Limit the amount of alcohol that you drink.  Do not use any products that contain  nicotine or tobacco, such as cigarettes and e-cigarettes. If you need help quitting, ask your health care provider.  Do not use illegal drugs. Where to find more information  Centers for Disease Control and Prevention: www.cdc.gov/std  American Sexual Health Association (ASHA): www.ashastd.org  [place] Department of Health and Human Services, Office on Women's Health: www.womenshealth.gov/ or https://www.womenshealth.gov/a-z-topics/bacterial-vaginosis Contact a health care provider if:  Your symptoms do not improve, even after treatment.  You have more discharge or pain when urinating.  You have a fever.  You have pain in your abdomen.  You have pain during sex.  You have vaginal bleeding between periods. Summary  Bacterial vaginosis is a vaginal infection that occurs when the normal balance of bacteria in the vagina is disrupted.  Because bacterial vaginosis increases your risk for STIs (sexually transmitted infections), getting treated can help reduce your risk for chlamydia, gonorrhea, herpes, and HIV (human immunodeficiency virus). Treatment is also important for preventing complications in pregnant women, because the condition can cause an Hannah Lutz (premature) delivery.  This condition is treated with antibiotic medicines. These may be given as a pill, a vaginal cream, or a medicine that is put into the vagina (suppository). This information is not intended to replace advice given to you by your health care provider. Make sure you discuss any questions you have with your health care provider. Document Revised: 05/16/2017 Document Reviewed: 02/17/2016 Elsevier Patient Education  2020 [employer]  

## 2019-09-30 NOTE — Progress Notes (Signed)
Acute Office Visit  Subjective:    Patient ID: Hannah Lutz, female    DOB: Mar 04, 1981, 39 y.o.   MRN: [id number]  Chief Complaint  Patient presents with  . Urinary Tract Infection    HPI Patient is in today for urinary discomfort and frequency with vaginal irritation, increased discharge, and vaginal odor. She was treated in January prophylactically for bacterial vaginosis, trich, gonorrhea, and chlamydia after an unprotected sexual encounter with her boyfriend she discovered was having unprotected sex with other persons. Her gonorrhea and chlamydia tests did not get run by the lab, but she was found to be positive for bacterial vaginosis.   She reports she started the oral metronidazole for BV and took 3 days worth then went out of town and forgot to bring the medication with her. She did finish the prescription and felt it did help with her symptoms, but she missed about 5 days between doses and does not feel all of her symptoms resolved.   She has not had sexual intercourse since that time.  She denies fever, back pain, abdominal pain, urinary frequency, or vaginal itching.   Past Medical History:  Diagnosis Date  . 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 Hypertension   . Tobacco use     Past Surgical History:  Procedure Laterality Date  . CERVICAL CERCLAGE      Family History  Problem Relation Age of Onset  . Breast cancer Mother 63  . Hypertension Father   . Breast cancer Maternal Aunt   . Breast cancer Maternal Grandmother 33  . Cancer Maternal Aunt     Social History   Socioeconomic History  . Marital status: Single    Spouse name: Not on file  . Number of children: Not on file  . Years of education: Not on file  . Highest education level: Not on file  Occupational History  . Not on file  Tobacco Use  . Smoking status: Current Some Day Smoker    Types: Cigarettes  . Smokeless tobacco: Never Used  . Tobacco comment: 1-3 cigs/day  Substance and Sexual Activity   . Alcohol use: Yes    Alcohol/week: 14.0 standard drinks    Types: 14 Glasses of wine per week  . Drug use: No  . Sexual activity: Yes    Birth control/protection: None  Other Topics Concern  . Not on file  Social History Narrative  . Not on file   Social Determinants of Health   Financial Resource Strain:   . Difficulty of Paying Living Expenses:   Food Insecurity:   . Worried About Charity fundraiser in the Last Year:   . Arboriculturist in the Last Year:   Transportation Needs:   . Film/video editor (Medical):   Marland Kitchen Lack of Transportation (Non-Medical):   Physical Activity:   . Days of Exercise per Week:   . Minutes of Exercise per Session:   Stress:   . Feeling of Stress :   Social Connections:   . Frequency of Communication with Friends and Family:   . Frequency of Social Gatherings with Friends and Family:   . Attends Religious Services:   . Active Member of Clubs or Organizations:   . Attends Archivist Meetings:   Marland Kitchen Marital Status:   Intimate Partner Violence:   . Fear of Current or Ex-Partner:   . Emotionally Abused:   Marland Kitchen Physically Abused:   . Sexually Abused:     Outpatient Medications Prior to  Visit  Medication Sig Dispense Refill  . diclofenac sodium (VOLTAREN) 1 % GEL Apply 4 g topically 4 (four) times daily. To affected joint. 100 g 11  . amLODipine (NORVASC) 5 MG tablet Take 1 tablet (5 mg total) by mouth daily. 90 tablet 3  . hydrochlorothiazide (HYDRODIURIL) 25 MG tablet Take 1 tablet (25 mg total) by mouth daily. 90 tablet 3  . metroNIDAZOLE (FLAGYL) 500 MG tablet Take 1 tablet (500 mg total) by mouth 2 (two) times daily. 14 tablet 0  . cefTRIAXone (ROCEPHIN) injection 500 mg      No facility-administered medications prior to visit.    Allergies  Allergen Reactions  . Candesartan Other (See Comments)    headache  . Lisinopril Cough  . Tramadol Nausea And Vomiting    Review of Systems  Constitutional: Negative for chills,  diaphoresis, fatigue and fever.  Respiratory: Negative for chest tightness and shortness of breath.   Gastrointestinal: Positive for abdominal pain. Negative for abdominal distention, constipation, diarrhea, nausea and vomiting.  Genitourinary: Positive for dysuria, frequency, pelvic pain, urgency, vaginal discharge and vaginal pain. Negative for decreased urine volume, flank pain, hematuria and menstrual problem.  Skin: Negative for rash.  Neurological: Negative for weakness, light-headedness and headaches.       Objective:    Physical Exam Vitals and nursing note reviewed.  Constitutional:      Appearance: Normal appearance.  HENT:     Head: Normocephalic.  Eyes:     Extraocular Movements: Extraocular movements intact.     Conjunctiva/sclera: Conjunctivae normal.     Pupils: Pupils are equal, round, and reactive to light.  Cardiovascular:     Rate and Rhythm: Normal rate and regular rhythm.     Pulses: Normal pulses.     Heart sounds: Normal heart sounds.  Pulmonary:     Effort: Pulmonary effort is normal.     Breath sounds: Normal breath sounds.  Abdominal:     General: Abdomen is flat. Bowel sounds are normal. There is no distension.     Palpations: Abdomen is soft.     Tenderness: There is no abdominal tenderness. There is no right CVA tenderness or left CVA tenderness.  Musculoskeletal:        General: Normal range of motion.     Cervical back: Normal range of motion.  Skin:    General: Skin is warm and dry.     Capillary Refill: Capillary refill takes less than 2 seconds.  Neurological:     General: No focal deficit present.     Mental Status: She is alert and oriented to person, place, and time.  Psychiatric:        Mood and Affect: Mood normal.        Behavior: Behavior normal.        Thought Content: Thought content normal.        Judgment: Judgment normal.     BP (!) 146/95   Pulse 90   Ht 5\' 6"  (1.676 m)   Wt 194 lb (88 kg)   BMI 31.31 kg/m  Wt  Readings from Last 3 Encounters:  09/30/19 194 lb (88 kg)  07/13/19 197 lb (89.4 kg)  05/24/19 201 lb 0.6 oz (91.2 kg)    There are no preventive care reminders to display for this patient.  There are no preventive care reminders to display for this patient.   No results found for: TSH Lab Results  Component Value Date   WBC 8.9 01/14/2019  HGB 12.6 01/14/2019   HCT 39.3 01/14/2019   MCV 78.0 (L) 01/14/2019   PLT 349 01/14/2019   Lab Results  Component Value Date   NA 138 01/14/2019   K 3.8 01/14/2019   CO2 26 01/14/2019   GLUCOSE 94 01/14/2019   BUN 12 01/14/2019   CREATININE 0.88 01/14/2019   BILITOT 0.2 01/14/2019   ALKPHOS 71 10/18/2016   AST 16 01/14/2019   ALT 17 01/14/2019   PROT 7.7 01/14/2019   ALBUMIN 3.8 10/18/2016   CALCIUM 9.6 01/14/2019   Lab Results  Component Value Date   CHOL 187 01/14/2019   Lab Results  Component Value Date   HDL 52 01/14/2019   Lab Results  Component Value Date   LDLCALC 112 (H) 01/14/2019   Lab Results  Component Value Date   TRIG 122 01/14/2019   Lab Results  Component Value Date   CHOLHDL 3.6 01/14/2019   Lab Results  Component Value Date   HGBA1C 5.2 10/18/2016       Assessment & Plan:   1. Dysuria Burning and discomfort with urination consistent with UTI symptoms. Given the patients recent infection with bacterial vaginosis and inconsistent medication use, a joint decision was made to treat BV and wait for urine culture results before treating for UTI.  - POCT URINALYSIS DIP (CLINITEK)  2. Vaginal discharge Symptoms consistent with bacterial vaginosis. Will treat with prophylactic antibiotics for BV. Patient instructed to complete the medication course without stopping.  Will also test for trich, yeast, and gonorrhea, and chlamydia.  Will notify patient if results reveal a different diagnosis and a need for medication change is required.  - WET PREP FOR TRICH, YEAST, CLUE - C. trachomatis/N.  gonorrhoeae RNA - metroNIDAZOLE (METROGEL) 0.75 % gel; Apply 1 application topically 2 (two) times daily.  Dispense: 45 g; Refill: 3  3. Vaginal odor Symptoms consistent with bacterial vaginosis. Will treat with prophylactic antibiotics for BV. Patient instructed to complete the medication course without stopping.  Will also test for trich, yeast, and gonorrhea, and chlamydia.  Will notify patient if results reveal a different diagnosis and a need for medication change is required.  - WET PREP FOR TRICH, YEAST, CLUE - C. trachomatis/N. gonorrhoeae RNA - metroNIDAZOLE (METROGEL) 0.75 % gel; Apply 1 application topically 2 (two) times daily.  Dispense: 45 g; Refill: 3  4. Hypertension goal BP (blood pressure) < 130/80 Blood pressure well controlled at home. Patient in need of refills of medication today.  - amLODipine (NORVASC) 5 MG tablet; Take 1 tablet (5 mg total) by mouth daily.  Dispense: 90 tablet; Refill: 3 - hydrochlorothiazide (HYDRODIURIL) 25 MG tablet; Take 1 tablet (25 mg total) by mouth daily.  Dispense: 90 tablet; Refill: 3  5. Chronic pain of both knees Refill provided for voltaren gel for chronic knee pain.  - diclofenac Sodium (VOLTAREN) 1 % GEL; Apply 2 g topically 4 (four) times daily. To affected joint.  Dispense: 100 g; Refill: 11  Return if symptoms worsen or fail to improve.  Tollie Eth, NP

## 2019-10-01 LAB — WET PREP FOR TRICH, YEAST, CLUE
MICRO NUMBER:: 10368253
Specimen Quality: ADEQUATE

## 2019-10-01 LAB — URINE CULTURE
MICRO NUMBER:: 10369952
SPECIMEN QUALITY:: ADEQUATE

## 2019-10-01 LAB — C. TRACHOMATIS/N. GONORRHOEAE RNA
C. trachomatis RNA, TMA: NOT DETECTED
N. gonorrhoeae RNA, TMA: NOT DETECTED

## 2019-10-04 ENCOUNTER — Telehealth: Payer: Self-pay

## 2019-10-04 ENCOUNTER — Other Ambulatory Visit: Payer: Self-pay | Admitting: Nurse Practitioner

## 2019-10-04 MED ORDER — METRONIDAZOLE 1.3 % VA GEL: 1.0000 | Freq: Every day | VAGINAL | 0 refills | Status: AC

## 2019-10-04 NOTE — Telephone Encounter (Signed)
Pt aware Rx has been sent to the pharmacy for her. No further questions or concerns at this time.  

## 2019-10-04 NOTE — Telephone Encounter (Signed)
Rx for metronidazole gel was sent to the pharmacy for her for treatment of BV. Pt states she was told a cream would be sent in for her. When she went to the pharmacy she found out it was the gel and that it was to be used externally only. Pt is something to be inserted vaginally to treat her sx. She states that if an insert cannot be prescribed that she would like to take another round of the pills. Pt states sx are starting to get worse and she needs relief now.

## 2019-10-04 NOTE — Telephone Encounter (Signed)
I apologize for selecting the incorrect medication. I have corrected this and sent it to the pharmacy. It is one applicator at bedtime daily for 7 days.

## 2019-10-22 ENCOUNTER — Telehealth: Payer: Self-pay

## 2019-10-22 DIAGNOSIS — N760 Acute vaginitis: Secondary | ICD-10-CM

## 2019-10-22 DIAGNOSIS — B9689 Other specified bacterial agents as the cause of diseases classified elsewhere: Secondary | ICD-10-CM

## 2019-10-22 MED ORDER — METRONIDAZOLE 1.3 % VA GEL: 1.0000 | Freq: Every day | VAGINAL | 3 refills | Status: DC

## 2019-10-22 NOTE — Telephone Encounter (Signed)
Pt called and stated that she was told that if the Rx prescribed to treat her BV did not work to get a refill and try again. Pt states she was told 3 refills would be sent to the pharmacy for her, but when she went to pick it up there were not any refills on file. Pt states she is still having the same sx and is requesting 3 refills to be sent in for her.

## 2019-10-22 NOTE — Telephone Encounter (Signed)
Prescription sent with refills

## 2019-10-25 NOTE — Telephone Encounter (Signed)
Pt aware Rx has been sent to the pharmacy. No further questions or concerns at this time.   

## 2020-02-14 ENCOUNTER — Other Ambulatory Visit: Payer: Self-pay

## 2020-02-14 ENCOUNTER — Ambulatory Visit (INDEPENDENT_AMBULATORY_CARE_PROVIDER_SITE_OTHER): Payer: 59 | Admitting: Sports Medicine

## 2020-02-14 ENCOUNTER — Ambulatory Visit (INDEPENDENT_AMBULATORY_CARE_PROVIDER_SITE_OTHER): Payer: 59

## 2020-02-14 DIAGNOSIS — G8929 Other chronic pain: Secondary | ICD-10-CM | POA: Diagnosis not present

## 2020-02-14 DIAGNOSIS — M25562 Pain in left knee: Secondary | ICD-10-CM | POA: Diagnosis not present

## 2020-02-14 DIAGNOSIS — M25561 Pain in right knee: Secondary | ICD-10-CM | POA: Diagnosis not present

## 2020-02-14 DIAGNOSIS — M19042 Primary osteoarthritis, left hand: Secondary | ICD-10-CM

## 2020-02-14 MED ORDER — MELOXICAM 15 MG PO TABS: ORAL_TABLET | ORAL | 3 refills | Status: DC

## 2020-02-14 MED ORDER — DICLOFENAC SODIUM 1 % EX GEL: 2.0000 g | Freq: Four times a day (QID) | CUTANEOUS | 11 refills | Status: DC

## 2020-02-14 NOTE — Assessment & Plan Note (Signed)
This is a pleasant 39 year old female, for some time she said pain in her left hand, localized at the IP joint and MCP. Worse when the weather is bad, interestingly I am unable to localize much pain to her CMC. Adding x-rays, meloxicam, hand conditioning exercises, return to see me in a month, injection versus MRI if no better.

## 2020-02-14 NOTE — Progress Notes (Signed)
    Procedures performed today:    None.  Independent interpretation of notes and tests performed by another provider:   None.  Brief History, Exam, Impression, and Recommendations:    Hannah Lutz is a pleasant 39yo female who comes in for complaints of left thumb pain. She reports the pain mainly in her IP and MCP joints of that thumb. The pain is fairly consistent but can flare up with weather changes. I am unable to elicit pain at the [facility] joint. 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 We are going to get Xrays of her hand today and give her at home exercises and meloxicam for symptom relief. We are going to follow up in 4-6 weeks for reevaluation.   Aurelio Jew, MS3   ___________________________________________ Ihor Austin. Benjamin Stain, M.D., ABFM., CAQSM. Primary Care and Sports Medicine [facility]  Adjunct Instructor of Family Medicine  [employer]

## 2020-04-20 ENCOUNTER — Other Ambulatory Visit: Payer: Self-pay | Admitting: Nurse Practitioner

## 2020-04-20 DIAGNOSIS — B9689 Other specified bacterial agents as the cause of diseases classified elsewhere: Secondary | ICD-10-CM

## 2020-05-08 ENCOUNTER — Ambulatory Visit: Payer: 59 | Admitting: Nurse Practitioner

## 2020-05-18 ENCOUNTER — Ambulatory Visit (INDEPENDENT_AMBULATORY_CARE_PROVIDER_SITE_OTHER): Payer: 59 | Admitting: Nurse Practitioner

## 2020-05-18 ENCOUNTER — Other Ambulatory Visit: Payer: Self-pay

## 2020-05-18 ENCOUNTER — Encounter: Payer: Self-pay | Admitting: Nurse Practitioner

## 2020-05-18 VITALS — BP 127/84 | HR 106 | Temp 98.2°F | Ht 66.0 in | Wt 194.7 lb

## 2020-05-18 DIAGNOSIS — N760 Acute vaginitis: Secondary | ICD-10-CM

## 2020-05-18 DIAGNOSIS — F5105 Insomnia due to other mental disorder: Secondary | ICD-10-CM

## 2020-05-18 DIAGNOSIS — F99 Mental disorder, not otherwise specified: Secondary | ICD-10-CM | POA: Diagnosis not present

## 2020-05-18 DIAGNOSIS — F411 Generalized anxiety disorder: Secondary | ICD-10-CM

## 2020-05-18 HISTORY — DX: Mental disorder, not otherwise specified: F99

## 2020-05-18 HISTORY — DX: Insomnia due to other mental disorder: F51.05

## 2020-05-18 LAB — WET PREP FOR TRICH, YEAST, CLUE
MICRO NUMBER:: 11268826
Specimen Quality: ADEQUATE

## 2020-05-18 MED ORDER — SERTRALINE HCL 50 MG PO TABS: 50.0000 mg | ORAL_TABLET | Freq: Every day | ORAL | 3 refills | Status: DC

## 2020-05-18 MED ORDER — METRONIDAZOLE 500 MG PO TABS: 500.0000 mg | ORAL_TABLET | Freq: Two times a day (BID) | ORAL | 0 refills | Status: DC

## 2020-05-18 MED ORDER — HYDROXYZINE HCL 50 MG PO TABS: 50.0000 mg | ORAL_TABLET | Freq: Every evening | ORAL | 3 refills | Status: DC | PRN

## 2020-05-18 MED ORDER — FLUCONAZOLE 150 MG PO TABS: 150.0000 mg | ORAL_TABLET | Freq: Once | ORAL | 1 refills | Status: AC

## 2020-05-18 NOTE — Assessment & Plan Note (Signed)
Symptoms and presentation consistent with generalized anxiety disorder.  Discussed with patient ways to help cope with this condition including prioritizing the "have to's" and "necessary" items and then allow the other items to take a lesser priority to her self and mental health.  Will plan to start sertraline as daily medication today to see if this is helpful with symptom management. Will also start PRN hydroxyzine for sleep.  Plan to follow-up in 6 weeks for mood.

## 2020-05-18 NOTE — Patient Instructions (Addendum)
I will be moving to [facility] at [facility] when we open (they are saying February of next year).    I want you to take one diflucan today and then the second one the day you finish the metronidazole pills.   You will take metronidazole twice a day for 5 days.  If you are still having symptoms at that time, give me a call and let me know. We can go a little longer with the medication.   We will test your urine and vaginal swab today for the infections we talked about. I will let you know what they say.   Sertraline should help with your anxiety. You can take one tablet every morning OR evening, whichever is easier for you to remember. It takes about 2 weeks for full effect.   Hydroxyzine is for sleep. You can take one tablet every night or only as needed at bedtime for sleep. It is not addictive so it does not hurt to take every night. If one isn't enough, you can take 2 and see how this helps. If this doesn't help, then let me know.   Please keep me updated on how you are feeling. Let's plan to see how the sertraline is working for you- we can do a video visit in about 6 weeks or sooner, if needed.    Generalized Anxiety Disorder, Adult Generalized anxiety disorder (GAD) is a mental health disorder. People with this condition constantly worry about everyday events. Unlike normal anxiety, worry related to GAD is not triggered by a specific event. These worries also do not fade or get better with time. GAD interferes with life functions, including relationships, work, and school. GAD can vary from mild to severe. People with severe GAD can have intense waves of anxiety with physical symptoms (panic attacks). What are the causes? The exact cause of GAD is not known. What increases the risk? This condition is more likely to develop in:  Women.  People who have a family history of anxiety disorders.  People who are very shy.  People who experience very stressful life  events, such as the death of a loved one.  People who have a very stressful family environment. What are the signs or symptoms? People with GAD often worry excessively about many things in their lives, such as their health and family. They may also be overly concerned about:  Doing well at work.  Being on time.  Natural disasters.  Friendships. Physical symptoms of GAD include:  Fatigue.  Muscle tension or having muscle twitches.  Trembling or feeling shaky.  Being easily startled.  Feeling like your heart is pounding or racing.  Feeling out of breath or like you cannot take a deep breath.  Having trouble falling asleep or staying asleep.  Sweating.  Nausea, diarrhea, or irritable bowel syndrome (IBS).  Headaches.  Trouble concentrating or remembering facts.  Restlessness.  Irritability. How is this diagnosed? Your health care provider can diagnose GAD based on your symptoms and medical history. You will also have a physical exam. The health care provider will ask specific questions about your symptoms, including how severe they are, when they started, and if they come and go. Your health care provider may ask you about your use of alcohol or drugs, including prescription medicines. Your health care provider may refer you to a mental health specialist for further evaluation. Your health care provider will do a thorough examination and may perform additional tests to rule out  other possible causes of your symptoms. To be diagnosed with GAD, a person must have anxiety that:  Is out of his or her control.  Affects several different aspects of his or her life, such as work and relationships.  Causes distress that makes him or her unable to take part in normal activities.  Includes at least three physical symptoms of GAD, such as restlessness, fatigue, trouble concentrating, irritability, muscle tension, or sleep problems. Before your health care provider can confirm a  diagnosis of GAD, these symptoms must be present more days than they are not, and they must last for six months or longer. How is this treated? The following therapies are usually used to treat GAD:  Medicine. Antidepressant medicine is usually prescribed for long-term daily control. Antianxiety medicines may be added in severe cases, especially when panic attacks occur.  Talk therapy (psychotherapy). Certain types of talk therapy can be helpful in treating GAD by providing support, education, and guidance. Options include: ? Cognitive behavioral therapy (CBT). People learn coping skills and techniques to ease their anxiety. They learn to identify unrealistic or negative thoughts and behaviors and to replace them with positive ones. ? Acceptance and commitment therapy (ACT). This treatment teaches people how to be mindful as a way to cope with unwanted thoughts and feelings. ? Biofeedback. This process trains you to manage your body's response (physiological response) through breathing techniques and relaxation methods. You will work with a therapist while machines are used to monitor your physical symptoms.  Stress management techniques. These include yoga, meditation, and exercise. A mental health specialist can help determine which treatment is best for you. Some people see improvement with one type of therapy. However, other people require a combination of therapies. Follow these instructions at home:  Take over-the-counter and prescription medicines only as told by your health care provider.  Try to maintain a normal routine.  Try to anticipate stressful situations and allow extra time to manage them.  Practice any stress management or self-calming techniques as taught by your health care provider.  Do not punish yourself for setbacks or for not making progress.  Try to recognize your accomplishments, even if they are small.  Keep all follow-up visits as told by your health care  provider. This is important. Contact a health care provider if:  Your symptoms do not get better.  Your symptoms get worse.  You have signs of depression, such as: ? A persistently sad, cranky, or irritable mood. ? Loss of enjoyment in activities that used to bring you joy. ? Change in weight or eating. ? Changes in sleeping habits. ? Avoiding friends or family members. ? Loss of energy for normal tasks. ? Feelings of guilt or worthlessness. Get help right away if:  You have serious thoughts about hurting yourself or others. If you ever feel like you may hurt yourself or others, or have thoughts about taking your own life, get help right away. You can go to your nearest emergency department or call:  Your local emergency services (911 in the [place]  A suicide crisis helpline, such as the National Suicide Prevention Lifeline at 251-148-6356. This is open 24 hours a day. Summary  Generalized anxiety disorder (GAD) is a mental health disorder that involves worry that is not triggered by a specific event.  People with GAD often worry excessively about many things in their lives, such as their health and family.  GAD may cause physical symptoms such as restlessness, trouble concentrating, sleep problems, frequent  sweating, nausea, diarrhea, headaches, and trembling or muscle twitching.  A mental health specialist can help determine which treatment is best for you. Some people see improvement with one type of therapy. However, other people require a combination of therapies. This information is not intended to replace advice given to you by your health care provider. Make sure you discuss any questions you have with your health care provider. Document Revised: 05/16/2017 Document Reviewed: 04/23/2016 Elsevier Patient Education  2020 [employer].  Vaginitis  Vaginitis is irritation and swelling (inflammation) of the vagina. It happens when normal bacteria and yeast in the vagina  grow too much. There are many types of this condition. Treatment will depend on the type you have. Follow these instructions at home: Lifestyle Keep your vagina area clean and dry. Avoid using soap. Rinse the area with water. Do not do the following until your doctor says it is okay: Wash and clean out the vagina (douche). Use tampons. Have sex. Wipe from front to back after going to the bathroom. Let air reach your vagina. Wear cotton underwear. Do not wear: Underwear while you sleep. Tight pants. Thong underwear. Underwear or nylons without a cotton panel. Take off any wet clothing, such as bathing suits, as soon as possible. Use gentle, non-scented products. Do not use things that can irritate the vagina, such as fabric softeners. Avoid the following products if they are scented: Feminine sprays. Detergents. Tampons. Feminine hygiene products. Soaps or bubble baths. Practice safe sex and use condoms. General instructions Take over-the-counter and prescription medicines only as told by your doctor. If you were prescribed an antibiotic medicine, take or use it as told by your doctor. Do not stop taking or using the antibiotic even if you start to feel better. Keep all follow-up visits as told by your doctor. This is important. Contact a doctor if: You have pain in your belly. You have a fever. Your symptoms last for more than 2-3 days. Get help right away if: You have a fever and your symptoms get worse all of a sudden. Summary Vaginitis is irritation and swelling of the vagina. It can happen when the normal bacteria and yeast in the vagina grow too much. There are many types. Treatment will depend on the type you have. Do not douche, use tampons , or have sex until your health care provider approves. When you can return to sex, practice safe sex and use condoms. This information is not intended to replace advice given to you by your health care provider. Make sure you  discuss any questions you have with your health care provider. Document Revised: 05/16/2017 Document Reviewed: 06/25/2016 Elsevier Patient Education  2020 [employer].

## 2020-05-18 NOTE — Progress Notes (Signed)
Established Patient Office Visit  Subjective:  Patient ID: Hannah Lutz, female    DOB: [birth year]  Age: 39 y.o. MRN: [id number]  CC: No chief complaint on file.   HPI Hannah Lutz is a 39 year old female presenting today with vaginal irritation and itching. She would also like to discuss anxiety and sleep.   Hallie reports that she has been dealing with issues with chronic bacterial vaginosis for the last few months. She has been using nuvessa gel, which has been helpful for her symptoms when they occur and she reports that she no longer has vaginal discharge or strong odor. She does report that for the past several weeks she has been experiencing vaginal itching near the introitus. She reports a burning sensation and intense itching. She does tell me that she had unprotected intercourse recently and the itching became more intense, which has her concerned for STI infection. She does not have vaginal discharge at this time, redness, inflammation, or visible lesions or irritation to the external vaginal area. She has no fever, chills, or abdominal pain. When she uses the nuvessa, the symptoms resolve for 2-3 days but then return. She does endorse taking a bubble bath daily.   She also tells me that she has been feeling extremely anxious and overwhelmed lately. She reports that she is also having difficulty sleeping despite taking diphenhydramine for sleep at night. She tells me she will fall asleep, but wakes up a few hours later with her mind racing. She tells me she feels anxious and on edge and overwhelmed. She is a single mother with one child in the home. 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 She reports feeling that she needs to help others and people frequently call her for help. She tells me that she has had co-workers tell her that she is trying to do too much and that she worries over things that she has no control over. She  tells me that she feels like she is about to have a nervous breakdown.  Past Medical History:  Diagnosis Date  . 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 Hypertension   . Tobacco use     Past Surgical History:  Procedure Laterality Date  . CERVICAL CERCLAGE      Family History  Problem Relation Age of Onset  . Breast cancer Mother 10  . Hypertension Father   . Breast cancer Maternal Aunt   . Breast cancer Maternal Grandmother 75  . Cancer Maternal Aunt     Social History   Socioeconomic History  . Marital status: Single    Spouse name: Not on file  . Number of children: Not on file  . Years of education: Not on file  . Highest education level: Not on file  Occupational History  . Not on file  Tobacco Use  . Smoking status: Current Some Day Smoker    Types: Cigarettes  . Smokeless tobacco: Never Used  . Tobacco comment: 1-3 cigs/day  Vaping Use  . Vaping Use: Never used  Substance and Sexual Activity  . Alcohol use: Yes    Alcohol/week: 14.0 standard drinks    Types: 14 Glasses of wine per week  . Drug use: No  . Sexual activity: Yes    Birth control/protection: None  Other Topics Concern  . Not on file  Social History Narrative  . Not on file   Social Determinants of Health   Financial Resource  Strain:   . Difficulty of Paying Living Expenses: Not on file  Food Insecurity:   . Worried About Programme researcher, broadcasting/film/videounning Out of Food in the Last Year: Not on file  . Ran Out of Food in the Last Year: Not on file  Transportation Needs:   . Lack of Transportation (Medical): Not on file  . Lack of Transportation (Non-Medical): Not on file  Physical Activity:   . Days of Exercise per Week: Not on file  . Minutes of Exercise per Session: Not on file  Stress:   . Feeling of Stress : Not on file  Social Connections:   . Frequency of Communication with Friends and Family: Not on file  . Frequency of Social Gatherings with Friends and Family: Not on file  . Attends Religious Services: Not on  file  . Active Member of Clubs or Organizations: Not on file  . Attends BankerClub or Organization Meetings: Not on file  . Marital Status: Not on file  Intimate Partner Violence:   . Fear of Current or Ex-Partner: Not on file  . Emotionally Abused: Not on file  . Physically Abused: Not on file  . Sexually Abused: Not on file    Outpatient Medications Prior to Visit  Medication Sig Dispense Refill  . amLODipine (NORVASC) 5 MG tablet Take 1 tablet (5 mg total) by mouth daily. 90 tablet 3  . diclofenac Sodium (VOLTAREN) 1 % GEL Apply 2 g topically 4 (four) times daily. To affected joint. 100 g 11  . hydrochlorothiazide (HYDRODIURIL) 25 MG tablet Take 1 tablet (25 mg total) by mouth daily. 90 tablet 3  . NUVESSA 1.3 % GEL INSERT 1 APPLICTION VAGINALLY AT BEDTIME 5 g 3  . metroNIDAZOLE (METROGEL) 0.75 % gel Apply 1 application topically 2 (two) times daily. 45 g 3  . meloxicam (MOBIC) 15 MG tablet One tab PO qAM with a meal for 2 weeks, then daily prn pain. (Patient not taking: Reported on 05/18/2020) 30 tablet 3   No facility-administered medications prior to visit.    Allergies  Allergen Reactions  . Candesartan Other (See Comments)    headache  . Lisinopril Cough  . Tramadol Nausea And Vomiting    ROS Review of Systems All review of systems negative except what is listed in the HPI    Objective:    Physical Exam Vitals and nursing note reviewed.  Constitutional:      Appearance: Normal appearance.  HENT:     Head: Normocephalic.  Eyes:     Extraocular Movements: Extraocular movements intact.     Conjunctiva/sclera: Conjunctivae normal.     Pupils: Pupils are equal, round, and reactive to light.  Cardiovascular:     Rate and Rhythm: Normal rate and regular rhythm.     Pulses: Normal pulses.     Heart sounds: Normal heart sounds.  Pulmonary:     Effort: Pulmonary effort is normal.  Abdominal:     General: Abdomen is flat.     Palpations: Abdomen is soft.     Tenderness:  There is no abdominal tenderness.  Genitourinary:    General: Normal vulva.     Exam position: Lithotomy position.     Pubic Area: No rash or pubic lice.      Labia:        Right: No rash, tenderness or lesion.        Left: No rash, tenderness or lesion.      Urethra: No prolapse, urethral pain, urethral swelling or urethral  lesion.  Musculoskeletal:        General: Normal range of motion.     Cervical back: Normal range of motion.  Skin:    General: Skin is warm and dry.     Capillary Refill: Capillary refill takes less than 2 seconds.  Neurological:     General: No focal deficit present.     Mental Status: She is alert and oriented to person, place, and time.  Psychiatric:        Mood and Affect: Mood normal.        Behavior: Behavior normal.        Thought Content: Thought content normal.        Judgment: Judgment normal.     BP 127/84   Pulse (!) 106   Temp 98.2 F (36.8 C)   Ht 5\' 6"  (1.676 m)   Wt 194 lb 11.2 oz (88.3 kg)   LMP 05/07/2020   SpO2 97%   BMI 31.43 kg/m  Wt Readings from Last 3 Encounters:  05/18/20 194 lb 11.2 oz (88.3 kg)  09/30/19 194 lb (88 kg)  07/13/19 197 lb (89.4 kg)     Health Maintenance Due  Topic Date Due  . COVID-19 Vaccine (2 - Pfizer 2-dose series) 04/08/2020    There are no preventive care reminders to display for this patient.  No results found for: TSH Lab Results  Component Value Date   WBC 8.9 01/14/2019   HGB 12.6 01/14/2019   HCT 39.3 01/14/2019   MCV 78.0 (L) 01/14/2019   PLT 349 01/14/2019   Lab Results  Component Value Date   NA 138 01/14/2019   K 3.8 01/14/2019   CO2 26 01/14/2019   GLUCOSE 94 01/14/2019   BUN 12 01/14/2019   CREATININE 0.88 01/14/2019   BILITOT 0.2 01/14/2019   ALKPHOS 71 10/18/2016   AST 16 01/14/2019   ALT 17 01/14/2019   PROT 7.7 01/14/2019   ALBUMIN 3.8 10/18/2016   CALCIUM 9.6 01/14/2019   Lab Results  Component Value Date   CHOL 187 01/14/2019   Lab Results  Component  Value Date   HDL 52 01/14/2019   Lab Results  Component Value Date   LDLCALC 112 (H) 01/14/2019   Lab Results  Component Value Date   TRIG 122 01/14/2019   Lab Results  Component Value Date   CHOLHDL 3.6 01/14/2019   Lab Results  Component Value Date   HGBA1C 5.2 10/18/2016      Assessment & Plan:   Problem List Items Addressed This Visit      Genitourinary   Acute vaginitis - Primary    Symptoms and presentation consistent with acute vaginitis.  Will test today for STI, BV, and yeast presence.  Strongly suspect that symptoms are exacerbated by bubble baths- recommend stopping use of bubble bath to see if this helps with symptoms.  Will treat with oral metronidazole today given the strong history of BV and oral diflucan x2 doses for yeast and prophylactic treatment.  Will let patient know results of tests and make changes to the plan of care as necessary. I am hopeful that oral treatment will help eliminate the infection and stopping the bubble baths will prevent reoccurrence. Follow-up if symptoms worsen or fail to improve.        Relevant Medications   metroNIDAZOLE (FLAGYL) 500 MG tablet   fluconazole (DIFLUCAN) 150 MG tablet   Other Relevant Orders   WET PREP FOR TRICH, YEAST, CLUE   C.  trachomatis/N. gonorrhoeae RNA     Other   Generalized anxiety disorder    Symptoms and presentation consistent with generalized anxiety disorder.  Discussed with patient ways to help cope with this condition including prioritizing the "have to's" and "necessary" items and then allow the other items to take a lesser priority to her self and mental health.  Will plan to start sertraline as daily medication today to see if this is helpful with symptom management. Will also start PRN hydroxyzine for sleep.  Plan to follow-up in 6 weeks for mood.       Relevant Medications   sertraline (ZOLOFT) 50 MG tablet   hydrOXYzine (ATARAX/VISTARIL) 50 MG tablet   Insomnia due to other mental  disorder    Insomnia secondary to anxiety.  Will plan to start sertraline today and hydroxyzine as needed for sleep.  Plan to follow-up in 6 weeks to see how medication is working.  I am hopeful with improved rest and medication she will see improvement in her mental health and stress levels.       Relevant Medications   hydrOXYzine (ATARAX/VISTARIL) 50 MG tablet      Meds ordered this encounter  Medications  . metroNIDAZOLE (FLAGYL) 500 MG tablet    Sig: Take 1 tablet (500 mg total) by mouth 2 (two) times daily.    Dispense:  14 tablet    Refill:  0  . sertraline (ZOLOFT) 50 MG tablet    Sig: Take 1 tablet (50 mg total) by mouth daily.    Dispense:  30 tablet    Refill:  3  . hydrOXYzine (ATARAX/VISTARIL) 50 MG tablet    Sig: Take 1 tablet (50 mg total) by mouth at bedtime and may repeat dose one time if needed. For itching.    Dispense:  30 tablet    Refill:  3  . fluconazole (DIFLUCAN) 150 MG tablet    Sig: Take 1 tablet (150 mg total) by mouth once for 1 dose. Repeat dose 72 hours if yeast infection persists    Dispense:  2 tablet    Refill:  1    Follow-up: Return in about 6 weeks (around 06/29/2020) for mood.    Tollie Eth, NP

## 2020-05-18 NOTE — Assessment & Plan Note (Signed)
Insomnia secondary to anxiety.  Will plan to start sertraline today and hydroxyzine as needed for sleep.  Plan to follow-up in 6 weeks to see how medication is working.  I am hopeful with improved rest and medication she will see improvement in her mental health and stress levels.

## 2020-05-18 NOTE — Assessment & Plan Note (Signed)
Symptoms and presentation consistent with acute vaginitis.  Will test today for STI, BV, and yeast presence.  Strongly suspect that symptoms are exacerbated by bubble baths- recommend stopping use of bubble bath to see if this helps with symptoms.  Will treat with oral metronidazole today given the strong history of BV and oral diflucan x2 doses for yeast and prophylactic treatment.  Will let patient know results of tests and make changes to the plan of care as necessary. I am hopeful that oral treatment will help eliminate the infection and stopping the bubble baths will prevent reoccurrence. Follow-up if symptoms worsen or fail to improve.

## 2020-05-19 LAB — C. TRACHOMATIS/N. GONORRHOEAE RNA
C. trachomatis RNA, TMA: NOT DETECTED
N. gonorrhoeae RNA, TMA: NOT DETECTED

## 2020-05-19 NOTE — Progress Notes (Signed)
Hannah Lutz,   Your vaginal swab does show the presence of trochomonas. The good news is, the metronidazole pills should cover this. Go ahead and do the full 7 days of the prescription instead of just 5 so we can make sure that we get rid of it all.   I am still waiting on the other tests, but will update you as soon as we hear something!  SaraBeth

## 2020-06-12 ENCOUNTER — Telehealth: Payer: Self-pay

## 2020-06-12 NOTE — Telephone Encounter (Signed)
Pt was trichomonas positive on 05/18/2020 and was prescribed metronidazole 500 mg BID x 7 days. Pt states that she took the medication but would miss some doses due to her scheduled. She has 1 tablet left at home. She states a burning sensation started yesterday, and would like to know if she needs to come in again for re-evaluation or if she can get a refill.   Per verbal instruction by Christen Butter, FNP who is covering Les Pou Early, FNP, pt will need to come in for re-evaluation.

## 2020-06-12 NOTE — Telephone Encounter (Signed)
Patient aware of recommendation. Pt scheduled an OV for 06/13/2020 at 11:10 AM for re-evaluation.

## 2020-06-13 ENCOUNTER — Other Ambulatory Visit: Payer: Self-pay

## 2020-06-13 ENCOUNTER — Ambulatory Visit (INDEPENDENT_AMBULATORY_CARE_PROVIDER_SITE_OTHER): Payer: 59 | Admitting: Medical-Surgical

## 2020-06-13 ENCOUNTER — Encounter: Payer: Self-pay | Admitting: Medical-Surgical

## 2020-06-13 VITALS — BP 146/97 | HR 102 | Temp 98.9°F | Ht 66.0 in | Wt 198.0 lb

## 2020-06-13 DIAGNOSIS — N949 Unspecified condition associated with female genital organs and menstrual cycle: Secondary | ICD-10-CM | POA: Diagnosis not present

## 2020-06-13 DIAGNOSIS — N898 Other specified noninflammatory disorders of vagina: Secondary | ICD-10-CM | POA: Diagnosis not present

## 2020-06-13 LAB — WET PREP FOR TRICH, YEAST, CLUE
MICRO NUMBER:: 11361779
Specimen Quality: ADEQUATE

## 2020-06-13 NOTE — Progress Notes (Signed)
Subjective:    CC: Vaginal irritation/burning  HPI: Pleasant 39 year old female presenting today for reevaluation of vaginal itching and irritation/burning.  She was seen earlier this month and found to be positive for trichomonas.  At that time she was prescribed metronidazole 500 mg twice daily x7 days.  With her scheduled, taking the medication as prescribed proved to be difficult.  She had quite a few missed doses and notes that when she called to set up his appointment yesterday, she still had 1 pill left.  Her symptoms did improve briefly but have not resolved and have not worsened again.  She has not been sexually active since her diagnosis earlier this month. Notes one day of a strong vaginal odor but thinks that was related to eating asparagus the day before. Denies vaginal discharge or unusual vaginal bleeding.   I reviewed the past medical history, family history, social history, surgical history, and allergies today and no changes were needed.  Please see the problem list section below in epic for further details.  Past Medical History: Past Medical History:  Diagnosis Date  . 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 Hypertension   . Tobacco use    Past Surgical History: Past Surgical History:  Procedure Laterality Date  . CERVICAL CERCLAGE     Social History: Social History   Socioeconomic History  . Marital status: Single    Spouse name: Not on file  . Number of children: Not on file  . Years of education: Not on file  . Highest education level: Not on file  Occupational History  . Not on file  Tobacco Use  . Smoking status: Current Some Day Smoker    Types: Cigarettes  . Smokeless tobacco: Never Used  . Tobacco comment: 1-3 cigs/day  Vaping Use  . Vaping Use: Never used  Substance and Sexual Activity  . Alcohol use: Yes    Alcohol/week: 14.0 standard drinks    Types: 14 Glasses of wine per week  . Drug use: No  . Sexual activity: Yes    Birth control/protection: None   Other Topics Concern  . Not on file  Social History Narrative  . Not on file   Social Determinants of Health   Financial Resource Strain: Not on file  Food Insecurity: Not on file  Transportation Needs: Not on file  Physical Activity: Not on file  Stress: Not on file  Social Connections: Not on file   Family History: Family History  Problem Relation Age of Onset  . Breast cancer Mother 54  . Hypertension Father   . Breast cancer Maternal Aunt   . Breast cancer Maternal Grandmother 61  . Cancer Maternal Aunt    Allergies: Allergies  Allergen Reactions  . Candesartan Other (See Comments)    headache  . Lisinopril Cough  . Tramadol Nausea And Vomiting   Medications: See med rec.  Review of Systems: See HPI for pertinent positives and negatives.   Objective:    General: Well Developed, well nourished, and in no acute distress.  Neuro: Alert and oriented x3.  HEENT: Normocephalic, atraumatic.  Skin: Warm and dry. Cardiac: Regular rate and rhythm, no murmurs rubs or gallops, no lower extremity edema.  Respiratory: Clear to auscultation bilaterally. Not using accessory muscles, speaking in full sentences. Pelvic exam: VULVA: some irritation to vulva without lesions or ulcerations, VAGINA: normal appearing vagina with normal color and discharge, no lesions, WET MOUNT done - results: Pending lab evaluation, exam chaperoned by Ihor Gully, MA.  Impression and  Recommendations:    1. Vaginal itching/burning Continued symptoms in the setting of nonadherence to prescribed metronidazole regimen.  Sending a stat wet prep to evaluate for continued infection.  Discussed the importance of following the prescribed regimen in order to clear the infection.  Continue to avoid sexual intercourse with all partners.  No further STI testing today as she was negative for gonorrhea and chlamydia and has had no further sexual encounters. - WET PREP FOR TRICH, YEAST, CLUE  Return if  symptoms worsen or fail to improve. ___________________________________________ Thayer Ohm, DNP, APRN, FNP-BC Primary Care and Sports Medicine [facility]

## 2020-08-10 ENCOUNTER — Encounter: Payer: Self-pay | Admitting: Family Medicine

## 2020-08-10 ENCOUNTER — Telehealth: Payer: Self-pay

## 2020-08-10 ENCOUNTER — Ambulatory Visit (INDEPENDENT_AMBULATORY_CARE_PROVIDER_SITE_OTHER): Payer: 59 | Admitting: Family Medicine

## 2020-08-10 ENCOUNTER — Other Ambulatory Visit: Payer: Self-pay

## 2020-08-10 VITALS — BP 129/90 | HR 94 | Temp 98.9°F | Resp 20 | Ht 66.0 in | Wt 195.0 lb

## 2020-08-10 DIAGNOSIS — N898 Other specified noninflammatory disorders of vagina: Secondary | ICD-10-CM | POA: Diagnosis not present

## 2020-08-10 NOTE — Progress Notes (Signed)
Acute Office Visit  Subjective:    Patient ID: Hannah Lutz, female    DOB: 02/03/1981, 40 y.o.   MRN: [id number]  Chief Complaint  Patient presents with  . Vaginal Itching    HPI Patient is in today for STD screening. She reports she tested positive for trich in early December. She thinks she may have missed a few doses, so she came back in late December when symptoms were still persisting. Repeat test was negative at that time. She reports her symptoms have persisted off and on since that time. She reports one episode of unprotected sex since the negative test in late December. She is in today wanting to get tested for Trich, GC/Chlamydia.   STD SCREENING Sexual activity:  sexually active since negative test in late December, unprotected Contraception: no Recent unprotected intercourse: yes History of sexually transmitted diseases: yes Previous sexually transmitted disease screening: yes Genital lesions: no Genital discharge: no Dysuria: no Swollen lymph nodes: no Fevers: no Rash: no     Past Medical History:  Diagnosis Date  . 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 Hypertension   . Tobacco use     Past Surgical History:  Procedure Laterality Date  . CERVICAL CERCLAGE      Family History  Problem Relation Age of Onset  . Breast cancer Mother 52  . Hypertension Father   . Breast cancer Maternal Aunt   . Breast cancer Maternal Grandmother 79  . Cancer Maternal Aunt     Social History   Socioeconomic History  . Marital status: Single    Spouse name: Not on file  . Number of children: Not on file  . Years of education: Not on file  . Highest education level: Not on file  Occupational History  . Not on file  Tobacco Use  . Smoking status: Current Some Day Smoker    Types: Cigarettes  . Smokeless tobacco: Never Used  . Tobacco comment: 1-3 cigs/day  Vaping Use  . Vaping Use: Never used  Substance and Sexual Activity  . Alcohol use: Yes    Alcohol/week: 14.0  standard drinks    Types: 14 Glasses of wine per week  . Drug use: No  . Sexual activity: Yes    Birth control/protection: None  Other Topics Concern  . Not on file  Social History Narrative  . Not on file   Social Determinants of Health   Financial Resource Strain: Not on file  Food Insecurity: Not on file  Transportation Needs: Not on file  Physical Activity: Not on file  Stress: Not on file  Social Connections: Not on file  Intimate Partner Violence: Not on file    Outpatient Medications Prior to Visit  Medication Sig Dispense Refill  . amLODipine (NORVASC) 5 MG tablet Take 1 tablet (5 mg total) by mouth daily. 90 tablet 3  . diclofenac Sodium (VOLTAREN) 1 % GEL Apply 2 g topically 4 (four) times daily. To affected joint. 100 g 11  . hydrochlorothiazide (HYDRODIURIL) 25 MG tablet Take 1 tablet (25 mg total) by mouth daily. 90 tablet 3  . hydrOXYzine (ATARAX/VISTARIL) 50 MG tablet Take 1 tablet (50 mg total) by mouth at bedtime and may repeat dose one time if needed. For itching. 30 tablet 3  . metroNIDAZOLE (FLAGYL) 500 MG tablet Take 1 tablet (500 mg total) by mouth 2 (two) times daily. 14 tablet 0  . NUVESSA 1.3 % GEL INSERT 1 APPLICTION VAGINALLY AT BEDTIME 5 g 3  . sertraline (ZOLOFT)  50 MG tablet Take 1 tablet (50 mg total) by mouth daily. 30 tablet 3   No facility-administered medications prior to visit.    Allergies  Allergen Reactions  . Candesartan Other (See Comments)    headache  . Lisinopril Cough  . Tramadol Nausea And Vomiting    Review of Systems All review of systems negative except what is listed in the HPI     Objective:    Physical Exam Vitals reviewed.  Constitutional:      Appearance: Normal appearance.  Genitourinary:    Comments: Patient deferred exam, self swab Neurological:     General: No focal deficit present.     Mental Status: She is alert and oriented to person, place, and time. Mental status is at baseline.  Psychiatric:         Mood and Affect: Mood normal.        Behavior: Behavior normal.        Thought Content: Thought content normal.        Judgment: Judgment normal.     BP 129/90 (BP Location: Right Arm, Patient Position: Sitting, Cuff Size: Normal)   Pulse 94   Temp 98.9 F (37.2 C) (Oral)   Resp 20   Ht 5\' 6"  (1.676 m)   Wt 195 lb (88.5 kg)   SpO2 97%   BMI 31.47 kg/m  Wt Readings from Last 3 Encounters:  08/10/20 195 lb (88.5 kg)  06/13/20 198 lb (89.8 kg)  05/18/20 194 lb 11.2 oz (88.3 kg)    There are no preventive care reminders to display for this patient.  There are no preventive care reminders to display for this patient.   No results found for: TSH Lab Results  Component Value Date   WBC 8.9 01/14/2019   HGB 12.6 01/14/2019   HCT 39.3 01/14/2019   MCV 78.0 (L) 01/14/2019   PLT 349 01/14/2019   Lab Results  Component Value Date   NA 138 01/14/2019   K 3.8 01/14/2019   CO2 26 01/14/2019   GLUCOSE 94 01/14/2019   BUN 12 01/14/2019   CREATININE 0.88 01/14/2019   BILITOT 0.2 01/14/2019   ALKPHOS 71 10/18/2016   AST 16 01/14/2019   ALT 17 01/14/2019   PROT 7.7 01/14/2019   ALBUMIN 3.8 10/18/2016   CALCIUM 9.6 01/14/2019   Lab Results  Component Value Date   CHOL 187 01/14/2019   Lab Results  Component Value Date   HDL 52 01/14/2019   Lab Results  Component Value Date   LDLCALC 112 (H) 01/14/2019   Lab Results  Component Value Date   TRIG 122 01/14/2019   Lab Results  Component Value Date   CHOLHDL 3.6 01/14/2019   Lab Results  Component Value Date   HGBA1C 5.2 10/18/2016       Assessment & Plan:   1. Vaginal irritation Vaginal irritation, burning, no discharge, no urinary symptoms. Testing for STDs today. Will update patient with results. Safe sex practices and hygiene reinforced. Pelvic exam deferred today, would recommend if symptoms persist.   - WET PREP FOR TRICH, YEAST, CLUE - C. trachomatis/N. gonorrhoeae RNA  Follow-up pending lab  results or sooner if symptoms worsen.    12/18/2016, NP

## 2020-08-10 NOTE — Patient Instructions (Signed)
Huel Coventry, and Bennett's Principles and Practice of Infectious Diseases (9th ed., pp. [address]). [place], [place]: Elsevier.">  Cervicitis  Cervicitis is when the cervix gets irritated and swollen. The cervix is the part of the womb (uterus) that opens up to the vagina. What are the causes?  An STI (sexually transmitted infection).  Objects, such as tampons, that are put in the vagina and left for too long.  A reaction to something that is put in the vagina, such as a douche.  An injury to the cervix.  An infection.  Radiation therapy. What increases the risk?  Unprotected sex.  Sex with a lot of partners.  A new sex partner.  Sex at a young age.  A history of STIs. What are the signs or symptoms?  Discharge from the vagina that smells bad or is gray, white, or yellow.  Pain or itchiness around the vagina.  Pain during sex.  Pain in the lower belly area or lower back, especially during sex.  Peeing (urinating) often.  Pain while peeing.  Bleeding from the vagina that is not normal, such as bleeding between menstrual periods, after sex, or after menopause. How is this diagnosed?  A pelvic exam.  A test of fluid from the vagina.  A swab test of the cells of the cervix.  Swabs sent to a lab.  Pee tests. How is this treated?  Medicines such as antibiotics or antivirals. If you are taking these, your sex partner may also need to take them.  Stopping the use of tampons or other things if they cause irritation. Follow these instructions at home: Medicines  Take over-the-counter and prescription medicines only as told by your doctor.  If you were prescribed an antibiotic medicine, take it as told by your doctor. Do not stop taking it even if you start to feel better. General instructions  Do not have sex until your doctor says it is okay.  Keep all follow-up visits as told by your doctor. This is important. Contact a doctor if:  Your symptoms  come back or get worse.  You have a fever.  You feel very tired.  Your belly hurts.  You feel like you are going to vomit or you vomit.  You have watery poop (diarrhea).  Your back hurts. Get help right away if:  You have very bad pain in your belly, and medicine does not help it.  You cannot pee. Summary  This condition happens when the cervix gets irritated and swollen.  It is caused by an infection, a reaction, or an injury to the cervix.  Take all medicines only as told by your doctor.  If you need to take an antibiotic, do not stop taking it even if you start to feel better.  Do not have sex until your doctor says it is okay. This information is not intended to replace advice given to you by your health care provider. Make sure you discuss any questions you have with your health care provider. Document Revised: 05/24/2020 Document Reviewed: 11/26/2018 Elsevier Patient Education  2021 [employer].

## 2020-08-10 NOTE — Telephone Encounter (Signed)
Pt called and stated that she is having vaginal irritation and burning. She saw Les Pou on 07/20/2019 and her wet prep came back positive for trichomonas so she was treated with metronidazole 500 mg. Pt RTC on 06/13/2020 because the sx had returned, but the wet prep came back negative for trichomonas, yeast, and BV. Pt states her sx have returned again. She states she has not been intimate with anyone. I suggested pt come in for an OV for further evaluation and testing. Pt agreeable to an OV this afternoon with Hyman Hopes, DNP, FNP-C at 4:00 with a 3:45 check in time.

## 2020-08-11 LAB — WET PREP FOR TRICH, YEAST, CLUE
MICRO NUMBER:: 11574905
Specimen Quality: ADEQUATE

## 2020-08-11 LAB — C. TRACHOMATIS/N. GONORRHOEAE RNA
C. trachomatis RNA, TMA: NOT DETECTED
N. gonorrhoeae RNA, TMA: NOT DETECTED

## 2020-08-11 NOTE — Progress Notes (Signed)
All of your testing was normal. I would recommend getting a thorough evaluation from your OBGYN. Let us know if you need anything else!

## 2020-08-15 ENCOUNTER — Telehealth: Payer: Self-pay

## 2020-08-15 DIAGNOSIS — N9489 Other specified conditions associated with female genital organs and menstrual cycle: Secondary | ICD-10-CM

## 2020-08-15 DIAGNOSIS — N898 Other specified noninflammatory disorders of vagina: Secondary | ICD-10-CM

## 2020-08-15 DIAGNOSIS — N760 Acute vaginitis: Secondary | ICD-10-CM

## 2020-08-15 DIAGNOSIS — N761 Subacute and chronic vaginitis: Secondary | ICD-10-CM

## 2020-08-15 DIAGNOSIS — N949 Unspecified condition associated with female genital organs and menstrual cycle: Secondary | ICD-10-CM

## 2020-08-15 NOTE — Telephone Encounter (Signed)
Pt aware of lab results from 08/10/2020 OV. Pt states sx are still present. I told her that Taylor said she needed further evaluation from her OB/GYN if sx persisted. Pt is not established with an OB/GYN. She is agreeable for a referral to OB/GYN here in our building. Order for referral tee's up below and is ready for review and approval/denial.  

## 2020-08-18 NOTE — Telephone Encounter (Signed)
Pt aware of lab results from 08/10/2020 OV. Pt states sx are still present. I told her that Ladona Ridgel said she needed further evaluation from her OB/GYN if sx persisted. Pt is not established with an OB/GYN. She is agreeable for a referral to OB/GYN here in our building. Order for referral tee's up below and is ready for review and approval/denial.

## 2020-09-11 ENCOUNTER — Encounter: Payer: Self-pay | Admitting: Obstetrics & Gynecology

## 2020-09-11 ENCOUNTER — Other Ambulatory Visit (HOSPITAL_COMMUNITY)
Admission: RE | Admit: 2020-09-11 | Discharge: 2020-09-11 | Disposition: A | Discharge status: Home / Self Care | Payer: 59 | Source: Ambulatory Visit | Attending: Obstetrics & Gynecology | Admitting: Obstetrics & Gynecology

## 2020-09-11 ENCOUNTER — Ambulatory Visit: Payer: 59 | Admitting: Obstetrics & Gynecology

## 2020-09-11 ENCOUNTER — Other Ambulatory Visit: Payer: Self-pay

## 2020-09-11 VITALS — BP 126/82 | HR 89 | Resp 16 | Ht 66.0 in | Wt 194.0 lb

## 2020-09-11 DIAGNOSIS — Z30012 Encounter for prescription of emergency contraception: Secondary | ICD-10-CM | POA: Diagnosis not present

## 2020-09-11 DIAGNOSIS — N898 Other specified noninflammatory disorders of vagina: Secondary | ICD-10-CM | POA: Diagnosis not present

## 2020-09-11 DIAGNOSIS — N949 Unspecified condition associated with female genital organs and menstrual cycle: Secondary | ICD-10-CM

## 2020-09-11 DIAGNOSIS — Z803 Family history of malignant neoplasm of breast: Secondary | ICD-10-CM | POA: Diagnosis not present

## 2020-09-11 MED ORDER — FLUCONAZOLE 150 MG PO TABS: 150.0000 mg | ORAL_TABLET | Freq: Once | ORAL | 0 refills | Status: AC

## 2020-09-11 NOTE — Progress Notes (Signed)
   Subjective:    Patient ID: Hannah Lutz, female    DOB: 1981-05-14, 40 y.o.   MRN: [id number]  HPI  40 yo female presents for several months of intermittent vaginal burning.  Patient did have trichomonas which has now been cleared.  Patient had a nonmolecular wet prep negative at her primary care.  I do not see an app team.  Patient is sexually active and uses condoms.  Sometimes she does not.  All STD tests have been negative to date.  Patient also interested in birth control.  She has mild hypertension and this is contraindicated with birth control pills at her age.  We discussed IUD Depo and Nexplanon and she is leaning towards Depo-Provera.  Patient does take a bath every day and has [employer] soaps that are in the bath water but not on her vulva.  Patient has recently douche with vinegar and a [place] yogurt solution.  Patient is also used vaginal still.  Patient is not having burning or discharge today.  The burning is never associated with discharge.  Review of Systems  Constitutional: Negative.   Respiratory: Negative.   Cardiovascular: Negative.   Gastrointestinal: Negative.   Genitourinary: Positive for pelvic pain and vaginal pain. Negative for dyspareunia, menstrual problem and vaginal discharge.  Musculoskeletal: Negative.   Psychiatric/Behavioral: Negative.        Objective:   Physical Exam Vitals reviewed.  Constitutional:      General: She is not in acute distress.    Appearance: She is well-developed.  HENT:     Head: Normocephalic and atraumatic.  Eyes:     Conjunctiva/sclera: Conjunctivae normal.  Cardiovascular:     Rate and Rhythm: Normal rate.  Pulmonary:     Effort: Pulmonary effort is normal.  Genitourinary:    Comments: Tanner V Vulva:  No lesion Vagina:  Pink, no lesions, white curd-like discharge, no blood Cervix:  No CMT Uterus:  Non tender, mobile Right adnexa--non tender, no mass Left adnexa--non tender, no mass  No evidence of  vulvodynia or pelvic pain.  Skin:    General: Skin is warm and dry.  Neurological:     Mental Status: She is alert and oriented to person, place, and time.  Psychiatric:        Mood and Affect: Mood normal.        Behavior: Behavior normal.    Vitals:   09/11/20 1512  BP: 126/82  Pulse: 89  Resp: 16  Weight: 194 lb (88 kg)  Height: 5\' 6"  (1.676 m)      Assessment & Plan:  40 year old female with intermittent vaginal burning 1.  Speculum exam discharge consistent with yeast.  We will try 1 dose of Diflucan.  Aptima sent. 2.  Reviewed good vulvar hygiene.  Patient is going to remove detergent from her laundry.  She will only wash her vulva once a day.  She also remain from douching or placing non physician-directed treatments on her vulva and vagina. 3.  Patient has a strong family history of breast cancer including her mother who died of breast cancer age 71.  We will send a hereditary breast cancer panel. 4.  Patient to refrain from unprotected intercourse for 2 weeks and can come back for Depo. 5.  Mammogram ordered  45 minutes spent with patient during exam, review of records, coordination of care, counseling, and documentation.

## 2020-09-12 LAB — CERVICOVAGINAL ANCILLARY ONLY
Bacterial Vaginitis (gardnerella): NEGATIVE
Candida Glabrata: NEGATIVE
Candida Vaginitis: NEGATIVE
Chlamydia: NEGATIVE
Comment: NEGATIVE
Comment: NEGATIVE
Comment: NEGATIVE
Comment: NEGATIVE
Comment: NEGATIVE
Comment: NORMAL
Neisseria Gonorrhea: NEGATIVE
Trichomonas: NEGATIVE

## 2020-09-21 ENCOUNTER — Encounter: Payer: Self-pay | Admitting: *Deleted

## 2020-09-21 NOTE — Progress Notes (Signed)
[facility] results forwarded to Dr Penne Lash and a copy mailed to pt's home address.

## 2020-10-03 ENCOUNTER — Other Ambulatory Visit: Payer: Self-pay | Admitting: Nurse Practitioner

## 2020-10-03 DIAGNOSIS — I10 Essential (primary) hypertension: Secondary | ICD-10-CM

## 2020-11-08 ENCOUNTER — Other Ambulatory Visit: Payer: Self-pay | Admitting: Nurse Practitioner

## 2020-11-08 DIAGNOSIS — I10 Essential (primary) hypertension: Secondary | ICD-10-CM

## 2020-11-24 NOTE — Telephone Encounter (Signed)
Patient calling in for the second time about blood pressure medication and needing a refill  Patient has historically been seen by Hannah Lutz at previous office in [place] but has not had an office visit since December 2021 and has not had surveillance labs since July of 2020 Patient made aware of lapse in treatment and needing to establish care with Hannah Lutz at [facility] Patient is requesting medication be sent to pharmacy in the interim until her appointment After discussing with Hannah Lutz, it has been advised that patient go to urgent care or ED to be evaluated as she has not had any routine care or for her blood pressure. Patient made aware of the recommendations.

## 2020-12-15 ENCOUNTER — Ambulatory Visit (HOSPITAL_BASED_OUTPATIENT_CLINIC_OR_DEPARTMENT_OTHER): Payer: 59 | Admitting: Nurse Practitioner

## 2020-12-15 ENCOUNTER — Encounter (HOSPITAL_BASED_OUTPATIENT_CLINIC_OR_DEPARTMENT_OTHER): Payer: Self-pay | Admitting: Nurse Practitioner

## 2020-12-15 ENCOUNTER — Other Ambulatory Visit: Payer: Self-pay

## 2020-12-15 VITALS — BP 115/82 | HR 100 | Temp 98.6°F | Ht 65.0 in | Wt 191.5 lb

## 2020-12-15 DIAGNOSIS — Z30011 Encounter for initial prescription of contraceptive pills: Secondary | ICD-10-CM

## 2020-12-15 DIAGNOSIS — Z7689 Persons encountering health services in other specified circumstances: Secondary | ICD-10-CM

## 2020-12-15 DIAGNOSIS — R7989 Other specified abnormal findings of blood chemistry: Secondary | ICD-10-CM

## 2020-12-15 DIAGNOSIS — I10 Essential (primary) hypertension: Secondary | ICD-10-CM

## 2020-12-15 DIAGNOSIS — R739 Hyperglycemia, unspecified: Secondary | ICD-10-CM

## 2020-12-15 HISTORY — DX: Persons encountering health services in other specified circumstances: Z76.89

## 2020-12-15 HISTORY — DX: Encounter for initial prescription of contraceptive pills: Z30.011

## 2020-12-15 MED ORDER — HYDROCHLOROTHIAZIDE 25 MG PO TABS: 25.0000 mg | ORAL_TABLET | Freq: Every day | ORAL | 3 refills | Status: DC

## 2020-12-15 MED ORDER — NORGESTIMATE-ETH ESTRADIOL 0.25-35 MG-MCG PO TABS: 1.0000 | ORAL_TABLET | Freq: Every day | ORAL | 3 refills | Status: DC

## 2020-12-15 MED ORDER — AMLODIPINE BESYLATE 5 MG PO TABS: 5.0000 mg | ORAL_TABLET | Freq: Every day | ORAL | 3 refills | Status: DC

## 2020-12-15 NOTE — Assessment & Plan Note (Signed)
BP at goal today Apologized to patient that I was unavailable when her medication ran out.  Explained that given the severity of her blood pressure and symptoms at the time, she needed to be seen in an emergency setting for complete evaluation as this could potentially be a life threatening situation that would not be well handled in this setting.  We will obtain labs today and evaluate for kidney function.  Recommend that she f/u in 6 months for HTN or sooner if she develops any concerning symptoms.  In the event of BP >170/100 please seek care in the emergency room for evaluation and treatment.

## 2020-12-15 NOTE — Progress Notes (Addendum)
Shawna Clamp, DNP, AGNP-c Primary Care Services ______________________________________________________________________  HPI Hannah Lutz is a 40 y.o. female presenting to [facility] today to establish care.   Patient Care Team: Lisbet Busker, Sung Amabile, NP as PCP - General (Nurse Practitioner)  Health Maintenance  Topic Date Due   Pneumococcal Vaccination (1 - PCV) Never done   Pap Smear  12/16/2020   Flu Shot  01/15/2021   Tetanus Vaccine  10/29/2029   Hepatitis C Screening: USPSTF Recommendation to screen - Ages 18-79 yo.  Completed   HIV Screening  Completed   HPV Vaccine  Aged Out   COVID-19 Vaccine  Discontinued     Concerns today: BP History of elevated BP with good control with amlodipine 5mg  and HCTZ 25mg .  Out of medication recently and experienced severe exacerbation of HTN with side effects of headache, dizziness, and vision changes. She reports she called the office for refills, but I was out of the office at that time and given the severity of her symptoms and blood pressure readings, she was advised to seek care in the Urgent Care or ED. She had not had labs in over a year.  She tells me refills were provided in UC and her symptoms have resolved and her BP has been under control.  SHe expresses frustration that she could not be seen in this office at the time.  She tells me her BP was 180's over 120's at the time of the call.  She denies shortness of breath, vision changes, headaches, dizziness, weakness, tremors, speech difficulties, or chest pain.  Birth Control She reports that she would like to start on OCP She is currently menstruating.    Patient Active Problem List   Diagnosis Date Noted   Oral contraception initiation 12/15/2020   Encounter to establish care 12/15/2020   Generalized anxiety disorder 05/18/2020   Insomnia due to other mental disorder 05/18/2020   Primary osteoarthritis, left hand  02/14/2020   Mass of left breast on mammogram 01/15/2019   Dyslipidemia, goal LDL below 100 01/14/2019   Tachycardia with heart rate 121-140 beats per minute 01/14/2019   Influenza 08/19/2018   Breast cancer screening, high risk patient 09/01/2017   Family history of breast cancer in mother 10/18/2016   Hypertension goal BP (blood pressure) < 130/80 10/18/2016   Class 1 obesity due to excess calories with serious comorbidity in adult 10/18/2016   Tobacco use 10/18/2016    PHQ9 Today: Depression screen [facility] 2/9 12/15/2020 01/15/2019 09/01/2017  Decreased Interest 0 0 0  Down, Depressed, Hopeless 0 0 0  PHQ - 2 Score 0 0 0  Altered sleeping 0 - -  Tired, decreased energy 0 - -  Change in appetite 0 - -  Feeling bad or failure about yourself  0 - -  Trouble concentrating 0 - -  Moving slowly or fidgety/restless 0 - -  Suicidal thoughts 0 - -  PHQ-9 Score 0 - -  Difficult doing work/chores Not difficult at all - -   GAD7 Today: GAD 7 : Generalized Anxiety Score 12/15/2020  Nervous, Anxious, on Edge 0  Control/stop worrying 0  Worry too much - different things 0  Trouble relaxing 0  Restless 0  Easily annoyed or irritable 0  Afraid - awful might happen 0  Total GAD 7 Score 0  Anxiety Difficulty Not difficult at all   ______________________________________________________________________ PMH Past Medical History:  Diagnosis Date   Abscess of axilla, left 05/26/2019  Episodic cigarette smoking dependence 01/15/2019   Hemoglobinuria 01/26/2019   Hemoglobinuria 01/26/2019   History of dysuria 01/14/2019   History of trichomoniasis 01/14/2019   Hypertension    New daily persistent headache 10/18/2016   Pleuritic chest pain 08/19/2018   Right ear pain 04/29/2019   Sinus tachycardia by electrocardiogram 01/15/2019   Tobacco use    Trichomonal vaginitis 12/19/2017   Wears contact lenses 10/18/2016    ROS All review of systems negative except what is listed in the HPI  PHYSICAL  EXAM Physical Exam Vitals and nursing note reviewed.  Constitutional:      Appearance: Normal appearance.  HENT:     Head: Normocephalic and atraumatic.  Eyes:     Extraocular Movements: Extraocular movements intact.     Conjunctiva/sclera: Conjunctivae normal.     Pupils: Pupils are equal, round, and reactive to light.  Neck:     Vascular: No carotid bruit.  Cardiovascular:     Rate and Rhythm: Regular rhythm. Tachycardia present.     Pulses: Normal pulses.     Heart sounds: Normal heart sounds.  Pulmonary:     Effort: Pulmonary effort is normal.     Breath sounds: Normal breath sounds.  Abdominal:     General: Abdomen is flat. Bowel sounds are normal. There is no distension.     Palpations: Abdomen is soft. There is no mass.     Tenderness: There is no abdominal tenderness. There is no right CVA tenderness, left CVA tenderness, guarding or rebound.  Musculoskeletal:        General: Normal range of motion.     Cervical back: Normal range of motion and neck supple. No tenderness.     Right lower leg: No edema.     Left lower leg: No edema.  Lymphadenopathy:     Cervical: No cervical adenopathy.  Skin:    General: Skin is warm and dry.     Capillary Refill: Capillary refill takes less than 2 seconds.  Neurological:     General: No focal deficit present.     Mental Status: She is alert and oriented to person, place, and time.  Psychiatric:        Mood and Affect: Mood normal.        Behavior: Behavior normal.        Thought Content: Thought content normal.        Judgment: Judgment normal.   ______________________________________________________________________ ASSESSMENT AND PLAN Problem List Items Addressed This Visit     Hypertension goal BP (blood pressure) < 130/80    BP at goal today Apologized to patient that I was unavailable when her medication ran out.  Explained that given the severity of her blood pressure and symptoms at the time, she needed to be seen in  an emergency setting for complete evaluation as this could potentially be a life threatening situation that would not be well handled in this setting.  We will obtain labs today and evaluate for kidney function.  Recommend that she f/u in 6 months for HTN or sooner if she develops any concerning symptoms.  In the event of BP >170/100 please seek care in the emergency room for evaluation and treatment.         Relevant Medications   norgestimate-ethinyl estradiol (ORTHO-CYCLEN, 28,) 0.25-35 MG-MCG tablet   amLODipine (NORVASC) 5 MG tablet   hydrochlorothiazide (HYDRODIURIL) 25 MG tablet   Other Relevant Orders   Comprehensive metabolic panel (Completed)   CBC with Differential/Platelet (Completed)  Oral contraception initiation    Will begin OCP today No UPT due to menses Monitor BP closely because hormones can increase BP If BP raises, consider progestin only contraceptive option.        Relevant Medications   norgestimate-ethinyl estradiol (ORTHO-CYCLEN, 28,) 0.25-35 MG-MCG tablet   Encounter to establish care - Primary    Review of current and past medical history, social history, medication, and family history.  Review of care gaps and health maintenance recommendations.  Records from recent providers to be requested if not available in Chart Review or Care Everywhere.  Recommendations for health maintenance, diet, and exercise provided.          Education provided today during visit and on AVS for patient to review at home.  Diet and Exercise recommendations provided.  Current diagnoses and recommendations discussed. HM recommendations reviewed with recommendations.    Outpatient Encounter Medications as of 12/15/2020  Medication Sig   norgestimate-ethinyl estradiol (ORTHO-CYCLEN, 28,) 0.25-35 MG-MCG tablet Take 1 tablet by mouth daily. Take one tablet once a day at about the same time.   NUVESSA 1.3 % GEL INSERT 1 APPLICTION VAGINALLY AT BEDTIME   [DISCONTINUED]  amLODipine (NORVASC) 5 MG tablet Take 1 tablet (5 mg total) by mouth daily.   [DISCONTINUED] diclofenac Sodium (VOLTAREN) 1 % GEL Apply 2 g topically 4 (four) times daily. To affected joint.   [DISCONTINUED] hydrochlorothiazide (HYDRODIURIL) 25 MG tablet Take 1 tablet (25 mg total) by mouth daily.   [DISCONTINUED] hydrOXYzine (ATARAX/VISTARIL) 50 MG tablet Take 1 tablet (50 mg total) by mouth at bedtime and may repeat dose one time if needed. For itching.   [DISCONTINUED] metroNIDAZOLE (FLAGYL) 500 MG tablet Take 1 tablet (500 mg total) by mouth 2 (two) times daily.   [DISCONTINUED] sertraline (ZOLOFT) 50 MG tablet Take 1 tablet (50 mg total) by mouth daily.   amLODipine (NORVASC) 5 MG tablet Take 1 tablet (5 mg total) by mouth daily.   hydrochlorothiazide (HYDRODIURIL) 25 MG tablet Take 1 tablet (25 mg total) by mouth daily.   No facility-administered encounter medications on file as of 12/15/2020.    Return in about 6 months (around 06/17/2021) for HTN.  Time: 50 minutes, >50% spent counseling, care coordination, chart review, and documentation.   Tollie Eth, DNP, AGNP-c

## 2020-12-15 NOTE — Patient Instructions (Addendum)
Recommendations from today's visit: I have sent in a years worth of medication for you. We will plan to get labs today to check your kidney function.  If you need anything send me a [employer].  Try to quit.   Information on diet, exercise, and health maintenance recommendations are listed below. This is information to help you be sure you are on track for optimal health and monitoring.   Please look over this and let us know if you have any questions or if you have completed any of the health maintenance outside of [place] so that we can be sure your records are up to date.  ___________________________________________________________  Thank you for choosing [place] at [facility] for your Primary Care needs. I am excited for the opportunity to partner with you to meet your health care goals. It was a pleasure meeting you today!  I am an Adult-Geriatric Nurse Practitioner with a background in caring for patients for more than 20 years. I received my Paediatric nurse in Nursing and my Doctor of Nursing Practice degrees at [employer]. I received additional fellowship training in primary care and sports medicine after receiving my doctorate degree. I provide primary care and sports medicine services to patients age 42 and older within this office. I am also a provider with the [facility] and the director of the APP Fellowship with [facility]  I am a [place] native, but have called the [place] area home for nearly 20 years and am proud to be a member of this community.   I am passionate about providing the best service to you through preventive medicine and supportive care. I consider you a part of the medical team and value your input. I work diligently to ensure that you are heard and your needs are met in a safe and effective manner. I want you to feel comfortable with me as your provider and want you to know that your  health concerns are important to me.   For your information, our office hours are Monday- Friday 8:00 AM - 5:00 PM At this time I am not in the office on Wednesdays.  If you have questions or concerns, please call our office at [phone] or send [place] a MyChart message and we will respond as quickly as possible.   For all urgent or time sensitive needs we ask that you please call the office to avoid delays. MyChart is not constantly monitored and replies may take up to 72 business hours.  MyChart Policy: MyChart allows for you to see your visit notes, after visit summary, provider recommendations, lab and tests results, make an appointment, request refills, and contact your provider or the office for non-urgent questions or concerns.  Providers are seeing patients during normal business hours and do not have built in time to review MyChart messages. We ask that you allow a minimum of 72 business hours for MyChart message responses.  Complex MyChart concerns may require a visit. Your provider may request you schedule a virtual or in person visit to ensure we are providing the best care possible. MyChart messages sent after 4:00 PM on Friday will not be received by the provider until Monday morning.    Lab and Test Results: You will receive your lab and test results on MyChart as soon as they are completed and results have been sent by the lab or testing facility. Due to this service, you will receive your  results BEFORE your provider.  Please allow a minimum of 72 business hours for your provider to receive and review lab and test results and contact you about.   Most lab and test result comments from the provider will be sent through [place]. Your provider may recommend changes to the plan of care, follow-up visits, repeat testing, ask questions, or request an office visit to discuss these results. You may reply directly to this message or call the office at [phone] to provide information for  the provider or set up an appointment. In some instances, you will be called with test results and recommendations. Please let us know if this is preferred and we will make note of this in your chart to provide this for you.    If you have not heard a response to your lab or test results in 72 business hours, please call the office to let us know.   After Hours: For all non-emergency after hours needs, please call the office at [phone] and select the option to reach the on-call provider service. On-call services are shared between multiple [place] offices and therefore it will not be possible to speak directly with your provider. On-call providers may provide medical advice and recommendations, but are unable to provide refills for maintenance medications.  For all emergency or urgent medical needs after normal business hours, we recommend that you seek care at the closest Urgent Care or Emergency Department to ensure appropriate treatment in a timely manner.  [facility] [place] has a 24 hour emergency room located on the ground floor for your convenience.    Please do not hesitate to reach out to [place] with concerns.   Thank you, again, for choosing me as your health care partner. I appreciate your trust and look forward to learning more about you.   Worthy Keeler, DNP, AGNP-c ___________________________________________________________  Health Maintenance Recommendations Screening Testing Mammogram Every 1 -2 years based on history and risk factors Starting at age 42 Pap Smear Ages 21-39 every 3 years Ages 13-65 every 5 years with HPV testing More frequent testing may be required based on results and history Colon Cancer Screening Every 1-10 years based on test performed, risk factors, and history Starting at age 42 Bone Density Screening Every 2-10 years based on history Starting at age 42 for women Recommendations for men differ based on medication usage,  history, and risk factors AAA Screening One time ultrasound Men 42-36 years old who have every smoked Lung Cancer Screening Low Dose Lung CT every 12 months Age 42-80 years with a 30 pack-year smoking history who still smoke or who have quit within the last 15 years  Screening Labs Routine  Labs: Complete Blood Count (CBC), Complete Metabolic Panel (CMP), Cholesterol (Lipid Panel) Every 6-12 months based on history and medications May be recommended more frequently based on current conditions or previous results Hemoglobin A1c Lab Every 3-12 months based on history and previous results Starting at age 66 or earlier with diagnosis of diabetes, high cholesterol, BMI >26, and/or risk factors Frequent monitoring for patients with diabetes to ensure blood sugar control Thyroid Panel (TSH w/ T3 & T4) Every 6 months based on history, symptoms, and risk factors May be repeated more often if on medication HIV One time testing for all patients 40 and older May be repeated more frequently for patients with increased risk factors or exposure Hepatitis C One time testing for all patients 26 and older May be repeated more frequently for  patients with increased risk factors or exposure Gonorrhea, Chlamydia Every 12 months for all sexually active persons 13-24 years Additional monitoring may be recommended for those who are considered high risk or who have symptoms PSA Men 47-13 years old with risk factors Additional screening may be recommended from age 40-69 based on risk factors, symptoms, and history  Vaccine Recommendations Tetanus Booster All adults every 10 years Flu Vaccine All patients 6 months and older every year COVID Vaccine All patients 12 years and older Initial dosing with booster May recommend additional booster based on age and health history HPV Vaccine 2 doses all patients age 38-26 Dosing may be considered for patients over 26 Shingles Vaccine (Shingrix) 2 doses all  adults 32 years and older Pneumonia (Pneumovax 23) All adults 76 years and older May recommend earlier dosing based on health history Pneumonia (Prevnar 76) All adults 49 years and older Dosed 1 year after Pneumovax 23  Additional Screening, Testing, and Vaccinations may be recommended on an individualized basis based on family history, health history, risk factors, and/or exposure.  __________________________________________________________  Diet Recommendations for All Patients  I recommend that all patients maintain a diet low in saturated fats, carbohydrates, and cholesterol. While this can be challenging at first, it is not impossible and small changes can make big differences.  Things to try: Decreasing the amount of soda, sweet tea, and/or juice to one or less per day and replace with water While water is always the first choice, if you do not like water you may consider adding a water additive without sugar to improve the taste other sugar free drinks Replace potatoes with a brightly colored vegetable at dinner Use healthy oils, such as canola oil or olive oil, instead of butter or hard margarine Limit your bread intake to two pieces or less a day Replace regular pasta with low carb pasta options Bake, broil, or grill foods instead of frying Monitor portion sizes  Eat smaller, more frequent meals throughout the day instead of large meals  An important thing to remember is, if you love foods that are not great for your health, you don't have to give them up completely. Instead, allow these foods to be a reward when you have done well. Allowing yourself to still have special treats every once in a while is a nice way to tell yourself thank you for working hard to keep yourself healthy.   Also remember that every day is a new day. If you have a bad day and "fall off the wagon", you can still climb right back up and keep moving along on your journey!  We have resources available to  help you!  Some websites that may be helpful include: www.http://carter.biz/  Www.VeryWellFit.com _____________________________________________________________  Activity Recommendations for All Patients  I recommend that all adults get at least 20 minutes of moderate physical activity that elevates your heart rate at least 5 days out of the week.  Some examples include: Walking or jogging at a pace that allows you to carry on a conversation Cycling (stationary bike or outdoors) Water aerobics Yoga Weight lifting Dancing If physical limitations prevent you from putting stress on your joints, exercise in a pool or seated in a chair are excellent options.  Do determine your MAXIMUM heart rate for activity: YOUR AGE - 220 = MAX HeartRate   Remember! Do not push yourself too hard.  Start slowly and build up your pace, speed, weight, time in exercise, etc.  Allow your body to  rest between exercise and get good sleep. You will need more water than normal when you are exerting yourself. Do not wait until you are thirsty to drink. Drink with a purpose of getting in at least 8, 8 ounce glasses of water a day plus more depending on how much you exercise and sweat.    If you begin to develop dizziness, chest pain, abdominal pain, jaw pain, shortness of breath, headache, vision changes, lightheadedness, or other concerning symptoms, stop the activity and allow your body to rest. If your symptoms are severe, seek emergency evaluation immediately. If your symptoms are concerning, but not severe, please let us know so that we can recommend further evaluation.   ________________________________________________________________  

## 2020-12-15 NOTE — Assessment & Plan Note (Signed)
Will begin OCP today No UPT due to menses Monitor BP closely because hormones can increase BP If BP raises, consider progestin only contraceptive option.

## 2020-12-15 NOTE — Assessment & Plan Note (Signed)
Review of current and past medical history, social history, medication, and family history.  Review of care gaps and health maintenance recommendations.  Records from recent providers to be requested if not available in Chart Review or Care Everywhere.  Recommendations for health maintenance, diet, and exercise provided.   

## 2020-12-16 LAB — COMPREHENSIVE METABOLIC PANEL
ALT: 21 IU/L (ref 0–32)
AST: 20 IU/L (ref 0–40)
Albumin/Globulin Ratio: 1.3 (ref 1.2–2.2)
Albumin: 4.2 g/dL (ref 3.8–4.8)
Alkaline Phosphatase: 92 IU/L (ref 44–121)
BUN/Creatinine Ratio: 19 (ref 9–23)
BUN: 18 mg/dL (ref 6–24)
Bilirubin Total: <0.2 mg/dL (ref 0.0–1.2)
CO2: 25 mmol/L (ref 20–29)
Calcium: 9.1 mg/dL (ref 8.7–10.2)
Chloride: 101 mmol/L (ref 96–106)
Creatinine, Ser: 0.97 mg/dL (ref 0.57–1.00)
Globulin, Total: 3.3 g/dL (ref 1.5–4.5)
Glucose: 107 mg/dL — ABNORMAL HIGH (ref 65–99)
Potassium: 3.5 mmol/L (ref 3.5–5.2)
Sodium: 139 mmol/L (ref 134–144)
Total Protein: 7.5 g/dL (ref 6.0–8.5)
eGFR: 75 mL/min/{1.73_m2} (ref >59)

## 2020-12-16 LAB — CBC WITH DIFFERENTIAL/PLATELET
Basophils Absolute: 0.1 10*3/uL (ref 0.0–0.2)
Basos: 1 %
EOS (ABSOLUTE): 0.2 10*3/uL (ref 0.0–0.4)
Eos: 2 %
Hematocrit: 42.7 % (ref 34.0–46.6)
Hemoglobin: 13.3 g/dL (ref 11.1–15.9)
Immature Grans (Abs): 0 10*3/uL (ref 0.0–0.1)
Immature Granulocytes: 0 %
Lymphocytes Absolute: 1.8 10*3/uL (ref 0.7–3.1)
Lymphs: 16 %
MCH: 24.6 pg — ABNORMAL LOW (ref 26.6–33.0)
MCHC: 31.1 g/dL — ABNORMAL LOW (ref 31.5–35.7)
MCV: 79 fL (ref 79–97)
Monocytes Absolute: 1.2 10*3/uL — ABNORMAL HIGH (ref 0.1–0.9)
Monocytes: 11 %
Neutrophils Absolute: 7.6 10*3/uL — ABNORMAL HIGH (ref 1.4–7.0)
Neutrophils: 70 %
Platelets: 379 10*3/uL (ref 150–450)
RBC: 5.4 x10E6/uL — ABNORMAL HIGH (ref 3.77–5.28)
RDW: 14.4 % (ref 11.7–15.4)
WBC: 11 10*3/uL — ABNORMAL HIGH (ref 3.4–10.8)

## 2020-12-19 ENCOUNTER — Telehealth (HOSPITAL_BASED_OUTPATIENT_CLINIC_OR_DEPARTMENT_OTHER): Payer: Self-pay

## 2020-12-19 DIAGNOSIS — R7309 Other abnormal glucose: Secondary | ICD-10-CM

## 2020-12-19 DIAGNOSIS — R718 Other abnormality of red blood cells: Secondary | ICD-10-CM

## 2020-12-19 DIAGNOSIS — R899 Unspecified abnormal finding in specimens from other organs, systems and tissues: Secondary | ICD-10-CM

## 2020-12-19 NOTE — Progress Notes (Signed)
Please notify patient:  Labs have come back- Glucose is elevated- I am pretty sure you were not fasting at the time, but will check for possible diabetes. Kidney and liver function look good! (Please add reflex A1c)  WBC counts are elevated indicating your are fighting an infection, possibly. This is not super high, but we will monitor this.  Your red blood counts are high. I am going to add a test to see if we can find out why these levels are off.  (Please add reflex TIBC, Fe, Ferritin and Reticulocyte Count)  Thank you!

## 2020-12-19 NOTE — Telephone Encounter (Signed)
-----   Message from Tollie Eth, NP sent at 12/19/2020  8:27 AM EDT ----- Please notify patient:  Labs have come back- Glucose is elevated- I am pretty sure you were not fasting at the time, but will check for possible diabetes. Kidney and liver function look good! (Please add reflex A1c)  WBC counts are elevated indicating your are fighting an infection, possibly. This is not super high, but we will monitor this.  Your red blood counts are high. I am going to add a test to see if we can find out why these levels are off.  (Please add reflex TIBC, Fe, Ferritin and Reticulocyte Count)  Thank you!

## 2020-12-19 NOTE — Telephone Encounter (Signed)
Add on labs sent to [facility]

## 2020-12-21 LAB — IRON AND TIBC
Iron Saturation: 18 % (ref 15–55)
Iron: 56 ug/dL (ref 27–159)
Total Iron Binding Capacity: 319 ug/dL (ref 250–450)
UIBC: 263 ug/dL (ref 131–425)

## 2020-12-21 LAB — SPECIMEN STATUS REPORT

## 2020-12-21 LAB — FERRITIN: Ferritin: 62 ng/mL (ref 15–150)

## 2020-12-21 LAB — HEMOGLOBIN A1C
Est. average glucose Bld gHb Est-mCnc: 117 mg/dL
Hgb A1c MFr Bld: 5.7 % — ABNORMAL HIGH (ref 4.8–5.6)

## 2020-12-22 NOTE — Telephone Encounter (Signed)
Left message for patient to call back for results and recommendations. 

## 2020-12-25 ENCOUNTER — Telehealth (HOSPITAL_BASED_OUTPATIENT_CLINIC_OR_DEPARTMENT_OTHER): Payer: Self-pay

## 2020-12-25 NOTE — Telephone Encounter (Signed)
Patient returned call regarding lab results and recommendations.  He is aware and understands.  Instructed her to contact the office with questions and concerns.

## 2020-12-28 ENCOUNTER — Other Ambulatory Visit: Payer: Self-pay | Admitting: Obstetrics & Gynecology

## 2020-12-28 DIAGNOSIS — Z803 Family history of malignant neoplasm of breast: Secondary | ICD-10-CM

## 2020-12-28 NOTE — Progress Notes (Signed)
Referred for family history of breast cancer and increased TC risk.

## 2021-01-01 NOTE — Progress Notes (Signed)
Please call patient.  A1c shows prediabetes present. I recommend a 1200-1500 calorie diet with no more than 150 grams of carbohydrates per day. 20 minutes of exercise daily.  Possible Kassidy Frankson iron deficiency. Recommend 325mg  Fe Sulfate M-W-F  Plan to recheck A1c, CBC, FE/TIBC, and Retic count in 3 months  Please schedule nurse visit in 3 months for CBC, A1c, Fe/TIBC, and reticulocyte count.

## 2021-01-02 ENCOUNTER — Telehealth (HOSPITAL_BASED_OUTPATIENT_CLINIC_OR_DEPARTMENT_OTHER): Payer: Self-pay

## 2021-01-02 NOTE — Telephone Encounter (Signed)
Called patient to discuss lab results and recommendations.  Patient is aware and understands.  Patient stated she will call back to schedule nurse visit for CBC, A1c, Fe/TIBC, and reticulocyte count.  Instructed patient to call the office with questions and concerns.

## 2021-01-02 NOTE — Telephone Encounter (Signed)
-----   Message from Tollie Eth, NP sent at 01/01/2021  8:58 AM EDT ----- Please call patient.  A1c shows prediabetes present. I recommend a 1200-1500 calorie diet with no more than 150 grams of carbohydrates per day. 20 minutes of exercise daily.  Possible early iron deficiency. Recommend 325mg  Fe Sulfate M-W-F  Plan to recheck A1c, CBC, FE/TIBC, and Retic count in 3 months  Please schedule nurse visit in 3 months for CBC, A1c, Fe/TIBC, and reticulocyte count.

## 2021-01-02 NOTE — Telephone Encounter (Signed)
Left message for patient to call back for results and recommendations. 

## 2021-02-21 ENCOUNTER — Other Ambulatory Visit: Payer: Self-pay | Admitting: General Surgery

## 2021-02-21 DIAGNOSIS — N6320 Unspecified lump in the left breast, unspecified quadrant: Secondary | ICD-10-CM

## 2021-03-02 ENCOUNTER — Other Ambulatory Visit: Payer: Self-pay | Admitting: Nurse Practitioner

## 2021-03-02 DIAGNOSIS — F5105 Insomnia due to other mental disorder: Secondary | ICD-10-CM

## 2021-03-02 DIAGNOSIS — F411 Generalized anxiety disorder: Secondary | ICD-10-CM

## 2021-04-12 ENCOUNTER — Other Ambulatory Visit: Payer: Self-pay

## 2021-04-12 ENCOUNTER — Other Ambulatory Visit (HOSPITAL_COMMUNITY)
Admission: RE | Admit: 2021-04-12 | Discharge: 2021-04-12 | Disposition: A | Discharge status: Home / Self Care | Payer: 59 | Source: Ambulatory Visit | Attending: Nurse Practitioner | Admitting: Nurse Practitioner

## 2021-04-12 ENCOUNTER — Ambulatory Visit (HOSPITAL_BASED_OUTPATIENT_CLINIC_OR_DEPARTMENT_OTHER): Payer: 59 | Admitting: Nurse Practitioner

## 2021-04-12 ENCOUNTER — Encounter (HOSPITAL_BASED_OUTPATIENT_CLINIC_OR_DEPARTMENT_OTHER): Payer: Self-pay | Admitting: Nurse Practitioner

## 2021-04-12 VITALS — BP 148/91 | HR 101 | Ht 65.0 in | Wt 186.0 lb

## 2021-04-12 DIAGNOSIS — B379 Candidiasis, unspecified: Secondary | ICD-10-CM | POA: Diagnosis not present

## 2021-04-12 DIAGNOSIS — Z113 Encounter for screening for infections with a predominantly sexual mode of transmission: Secondary | ICD-10-CM | POA: Diagnosis not present

## 2021-04-12 DIAGNOSIS — I10 Essential (primary) hypertension: Secondary | ICD-10-CM | POA: Diagnosis not present

## 2021-04-12 DIAGNOSIS — F5101 Primary insomnia: Secondary | ICD-10-CM

## 2021-04-12 DIAGNOSIS — Z0000 Encounter for general adult medical examination without abnormal findings: Secondary | ICD-10-CM | POA: Insufficient documentation

## 2021-04-12 DIAGNOSIS — Z124 Encounter for screening for malignant neoplasm of cervix: Secondary | ICD-10-CM | POA: Insufficient documentation

## 2021-04-12 DIAGNOSIS — Z7184 Encounter for health counseling related to travel: Secondary | ICD-10-CM | POA: Insufficient documentation

## 2021-04-12 HISTORY — DX: Candidiasis, unspecified: B37.9

## 2021-04-12 HISTORY — DX: Encounter for screening for infections with a predominantly sexual mode of transmission: Z11.3

## 2021-04-12 MED ORDER — HYDROXYZINE HCL 25 MG PO TABS: 25.0000 mg | ORAL_TABLET | Freq: Every evening | ORAL | 2 refills | Status: DC | PRN

## 2021-04-12 MED ORDER — FLUCONAZOLE 150 MG PO TABS: ORAL_TABLET | ORAL | 6 refills | Status: DC

## 2021-04-12 NOTE — Patient Instructions (Addendum)
We have performed STI screening and a pap smear today. I have also tested for bacterial vaginosis and yeast infection to be sure. We will let you know the results as soon as they are available and contact you with any concerning findings.   If you are having symptoms, please avoid any sexual contact until the results have returned and are found to be negative to avoid any unwanted transmission of STI.   Safe sex practices with abstinence or monogamous sexual relationships are best to avoid STI. In cases where you have a new partner, STI testing is always recommended before and after sexual encounters to help reduce transmission rates and best protect yourself.    I am changing your blood pressure medication: I want you to take a total of Amlodipine 10mg  (2 of the 5mg  tabs) and Hydrochlorothiazide 50mg  (2 of the 25mg  tabs). Let me know if your blood pressures are looking any better in 7 days.   I sent in diflucan for you to the pharmacy. You can take one tablet today and another in 3 days if you still have symptoms. I have put refills on this for you in case you have a repeat episode.  I will let you know what the testing shows and if we need to add any additional treatment.   Try the aquaphor on the outside of the vaginal area daily to see if this helps with the dryness.

## 2021-04-12 NOTE — Assessment & Plan Note (Signed)
BP still elevated today.  Will increase amlodipine to 10mg  and HCTZ to 50mg  today and see if we can get better control. We may need to consider changing regimen alltogher. She is going to work on her diet and reduce her salt intake Clonidine 0.1mg  provided today with decrease in diastolic BP.  She will let me know how her BP looks at home and if she has resolution of headaches after 1 week of treatment.

## 2021-04-12 NOTE — Assessment & Plan Note (Signed)
Based on presentation today Treatment sent Will await swab for further recommendations.

## 2021-04-12 NOTE — Progress Notes (Signed)
Acute Office Visit  Subjective:    Patient ID: Hannah Lutz, female    DOB: 1980/08/01, 40 y.o.   MRN: [id number]  Chief Complaint  Patient presents with   Gynecologic Exam    Patient is concerned about her bp being elevated while she is on bp medication.  Patient has burning sensation and itching for one week.  Patient admits to unprotected sex 34 days ago.  Patient states she is out of the [place] and it usually helps.      HPI Patient is in today for STI screening. She is due for pap- we will go ahead and do this today, as well.   STD SCREENING Sexual activity:  Recent unprotected sexual encounter. Encounter about 30 days ago. She has had a period since the encounter.  Started having vaginal irritation and itching earlier this week. Increased discharge. No odor.  possible STD exposure Contraception: no method Recent unprotected intercourse: yes The patient reports a past history of: trichomonas. Most recent date of STI screening: Unknown Symptoms Present: vaginal discharge: white, thin, watery, thick, and odorless, vaginal irritation: fairly severe  She has a history of recurrent BV and yeast. She uses replense vaginal gel to help with vaginal dryness.   Her blood pressure has been elevated recently and she has been having daily headaches.  This morning she had bacon and she reports that typically increases her BP when she eats this.  She is currently taking amlodipine 5mg  and HCTZ 25mg   She denies any vision changes, dizziness, weakness, CP, or palpitations.   She tells me she is not sleeping well at night, which could contribute to her elevated BP.  She has tried OTC medicatoins in the past, but these were not helpful.   Review of Systems All review of systems negative except what is listed in the HPI     Objective:    Physical Exam Vitals and nursing note reviewed. Exam conducted with a chaperone present.  Constitutional:      Appearance: Normal appearance.   HENT:     Head: Normocephalic.  Eyes:     Extraocular Movements: Extraocular movements intact.     Conjunctiva/sclera: Conjunctivae normal.     Pupils: Pupils are equal, round, and reactive to light.  Cardiovascular:     Rate and Rhythm: Normal rate.     Pulses: Normal pulses.  Pulmonary:     Effort: Pulmonary effort is normal.  Abdominal:     General: There is no distension.     Palpations: Abdomen is soft. There is no mass.     Tenderness: There is no abdominal tenderness. There is no guarding or rebound.     Hernia: No hernia is present.  Genitourinary:    Vagina: Vaginal discharge present.     Cervix: Discharge present. No cervical motion tenderness, friability or erythema.  Musculoskeletal:     Right lower leg: No edema.     Left lower leg: No edema.  Skin:    General: Skin is warm and dry.     Capillary Refill: Capillary refill takes less than 2 seconds.  Neurological:     General: No focal deficit present.     Mental Status: She is alert and oriented to person, place, and time.     Cranial Nerves: No cranial nerve deficit.     Sensory: No sensory deficit.     Motor: No weakness.     Gait: Gait normal.  Psychiatric:        Mood  and Affect: Mood normal.        Behavior: Behavior normal.        Thought Content: Thought content normal.        Judgment: Judgment normal.    BP (!) 148/91   Pulse (!) 101   Ht 5\' 5"  (1.651 m)   Wt 186 lb (84.4 kg)   LMP 04/01/2021 (Approximate)   SpO2 100%   BMI 30.95 kg/m  Wt Readings from Last 3 Encounters:  04/12/21 186 lb (84.4 kg)  12/15/20 191 lb 8 oz (86.9 kg)  09/11/20 194 lb (88 kg)       Assessment & Plan:   Problem List Items Addressed This Visit     Hypertension goal BP (blood pressure) < 130/80    BP still elevated today.  Will increase amlodipine to 10mg  and HCTZ to 50mg  today and see if we can get better control. We may need to consider changing regimen alltogher. She is going to work on her diet and reduce  her salt intake Clonidine 0.1mg  provided today with decrease in diastolic BP.  She will let me know how her BP looks at home and if she has resolution of headaches after 1 week of treatment.       Routine screening for STI (sexually transmitted infection) - Primary    Aptima swab today for yeast, BV, trich, gonorrhea, and chlamydia.  No pap today as she just had this done last year.  Mixture of thick and thin white discharge present in the vagina with no odor.  Cervix non-friable or erythematous. No pelvic or lower abdominal pain present.  Will treat with diflucan today for suspected yeast based on appearance.  Will follow-up with additional treatment as needed based on results.       Relevant Orders   Cervicovaginal ancillary only   Primary insomnia    Difficulty falling asleep at night.  Will trial hydroxyzine to see if this is helpful.       Relevant Medications   hydrOXYzine (ATARAX/VISTARIL) 25 MG tablet   Candida infection    Based on presentation today Treatment sent Will await swab for further recommendations.       Relevant Medications   fluconazole (DIFLUCAN) 150 MG tablet     Meds ordered this encounter  Medications   hydrOXYzine (ATARAX/VISTARIL) 25 MG tablet    Sig: Take 1 tablet (25 mg total) by mouth at bedtime and may repeat dose one time if needed. For sleep.    Dispense:  60 tablet    Refill:  2   fluconazole (DIFLUCAN) 150 MG tablet    Sig: Take one tablet today. If symptoms are still present, take a second dose in 3 days.    Dispense:  2 tablet    Refill:  6   F/U PRN for vaginal discharge. F/U in 7 days for bp with [facility] message.   , NP

## 2021-04-12 NOTE — Assessment & Plan Note (Signed)
Aptima swab today for yeast, BV, trich, gonorrhea, and chlamydia.  No pap today as she just had this done last year.  Mixture of thick and thin white discharge present in the vagina with no odor.  Cervix non-friable or erythematous. No pelvic or lower abdominal pain present.  Will treat with diflucan today for suspected yeast based on appearance.  Will follow-up with additional treatment as needed based on results.

## 2021-04-12 NOTE — Assessment & Plan Note (Signed)
Difficulty falling asleep at night.  Will trial hydroxyzine to see if this is helpful.

## 2021-04-13 LAB — CERVICOVAGINAL ANCILLARY ONLY
Bacterial Vaginitis (gardnerella): POSITIVE — AB
Candida Glabrata: NEGATIVE
Candida Vaginitis: NEGATIVE
Chlamydia: NEGATIVE
Comment: NEGATIVE
Comment: NEGATIVE
Comment: NEGATIVE
Comment: NEGATIVE
Comment: NEGATIVE
Comment: NORMAL
Neisseria Gonorrhea: NEGATIVE
Trichomonas: NEGATIVE

## 2021-04-23 ENCOUNTER — Emergency Department (HOSPITAL_COMMUNITY)
Admission: EM | Admit: 2021-04-23 | Discharge: 2021-04-23 | Disposition: A | Discharge status: Home / Self Care | Payer: 59 | Attending: Emergency Medicine | Admitting: Emergency Medicine

## 2021-04-23 DIAGNOSIS — M549 Dorsalgia, unspecified: Secondary | ICD-10-CM | POA: Insufficient documentation

## 2021-04-23 DIAGNOSIS — R519 Headache, unspecified: Secondary | ICD-10-CM | POA: Insufficient documentation

## 2021-04-23 DIAGNOSIS — Y92410 Unspecified street and highway as the place of occurrence of the external cause: Secondary | ICD-10-CM | POA: Insufficient documentation

## 2021-04-23 DIAGNOSIS — F17210 Nicotine dependence, cigarettes, uncomplicated: Secondary | ICD-10-CM | POA: Insufficient documentation

## 2021-04-23 DIAGNOSIS — I10 Essential (primary) hypertension: Secondary | ICD-10-CM | POA: Insufficient documentation

## 2021-04-23 DIAGNOSIS — M542 Cervicalgia: Secondary | ICD-10-CM | POA: Diagnosis present

## 2021-04-23 MED ORDER — METHOCARBAMOL 500 MG PO TABS: 500.0000 mg | ORAL_TABLET | Freq: Three times a day (TID) | ORAL | 0 refills | Status: DC | PRN

## 2021-04-23 MED ORDER — LIDOCAINE 4 % EX PTCH: 1.0000 | MEDICATED_PATCH | Freq: Two times a day (BID) | CUTANEOUS | 0 refills | Status: DC | PRN
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
## 2021-04-24 ENCOUNTER — Ambulatory Visit (HOSPITAL_BASED_OUTPATIENT_CLINIC_OR_DEPARTMENT_OTHER): Payer: 59 | Admitting: Nurse Practitioner

## 2021-04-24 ENCOUNTER — Telehealth (HOSPITAL_BASED_OUTPATIENT_CLINIC_OR_DEPARTMENT_OTHER): Payer: Self-pay

## 2021-04-24 NOTE — Telephone Encounter (Signed)
Contact patient to inquire about symptoms Patient states she feels the robaxin in not helping She is still having neck pain, lower back pain, and tension headaches She states she didn't have any imaging completed at the ED Patient is agreeable to me forwarding concerns to Enid Skeens and potentially coming in next week

## 2021-04-26 ENCOUNTER — Other Ambulatory Visit: Payer: Self-pay

## 2021-04-26 ENCOUNTER — Ambulatory Visit (HOSPITAL_BASED_OUTPATIENT_CLINIC_OR_DEPARTMENT_OTHER): Payer: Self-pay | Admitting: Nurse Practitioner

## 2021-04-26 ENCOUNTER — Ambulatory Visit
Admission: RE | Admit: 2021-04-26 | Discharge: 2021-04-26 | Disposition: A | Discharge status: Home / Self Care | Payer: 59 | Source: Ambulatory Visit | Attending: Nurse Practitioner | Admitting: Nurse Practitioner

## 2021-04-26 ENCOUNTER — Other Ambulatory Visit (HOSPITAL_BASED_OUTPATIENT_CLINIC_OR_DEPARTMENT_OTHER): Payer: Self-pay | Admitting: Nurse Practitioner

## 2021-04-26 ENCOUNTER — Encounter (HOSPITAL_BASED_OUTPATIENT_CLINIC_OR_DEPARTMENT_OTHER): Payer: Self-pay | Admitting: Nurse Practitioner

## 2021-04-26 DIAGNOSIS — M542 Cervicalgia: Secondary | ICD-10-CM

## 2021-04-26 DIAGNOSIS — M549 Dorsalgia, unspecified: Secondary | ICD-10-CM

## 2021-04-26 MED ORDER — OXYCODONE-ACETAMINOPHEN 5-325 MG PO TABS: 1.0000 | ORAL_TABLET | Freq: Four times a day (QID) | ORAL | 0 refills | Status: DC | PRN

## 2021-04-26 MED ORDER — CYCLOBENZAPRINE HCL 10 MG PO TABS: 10.0000 mg | ORAL_TABLET | Freq: Three times a day (TID) | ORAL | 0 refills | Status: DC | PRN

## 2021-04-26 MED ORDER — PREDNISONE 50 MG PO TABS: 50.0000 mg | ORAL_TABLET | Freq: Every day | ORAL | 0 refills | Status: DC

## 2021-04-26 NOTE — Patient Instructions (Addendum)
I have sent in prednisone for inflammation, flexeril for muscle spasm, and percocet for pain not controlled with the flexeril. These will make you very sleepy so do not take this and drive.   I have sent orders for imaging at [facility] inside [facility] Address: [address], [place], [place] [address] Hours: 8am -5:30pm Monday through Friday You can walk-in to have xrays done there.    I want you to use ice 20 minutes on and heat 20 minutes on to your back and neck multiple times a day to help with inflammation and pain.   Start with gentle exercises for the pain, but do not do anything that makes you dizzy or feel sharp pain.   I will send you a note for missed work.
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
## 2021-04-30 ENCOUNTER — Other Ambulatory Visit (HOSPITAL_BASED_OUTPATIENT_CLINIC_OR_DEPARTMENT_OTHER): Payer: Self-pay | Admitting: Nurse Practitioner

## 2021-04-30 ENCOUNTER — Encounter (HOSPITAL_BASED_OUTPATIENT_CLINIC_OR_DEPARTMENT_OTHER): Payer: Self-pay | Admitting: Nurse Practitioner

## 2021-05-01 ENCOUNTER — Encounter (HOSPITAL_BASED_OUTPATIENT_CLINIC_OR_DEPARTMENT_OTHER): Payer: Self-pay | Admitting: Nurse Practitioner

## 2021-05-01 ENCOUNTER — Other Ambulatory Visit: Payer: Self-pay | Admitting: Nurse Practitioner

## 2021-05-01 DIAGNOSIS — F411 Generalized anxiety disorder: Secondary | ICD-10-CM

## 2021-05-08 ENCOUNTER — Encounter (HOSPITAL_BASED_OUTPATIENT_CLINIC_OR_DEPARTMENT_OTHER): Payer: Self-pay | Admitting: Physical Therapy

## 2021-05-08 ENCOUNTER — Ambulatory Visit (HOSPITAL_BASED_OUTPATIENT_CLINIC_OR_DEPARTMENT_OTHER): Payer: 59 | Attending: Nurse Practitioner | Admitting: Physical Therapy

## 2021-05-08 ENCOUNTER — Other Ambulatory Visit: Payer: Self-pay

## 2021-05-08 DIAGNOSIS — M545 Low back pain, unspecified: Secondary | ICD-10-CM | POA: Diagnosis not present

## 2021-05-08 DIAGNOSIS — G8929 Other chronic pain: Secondary | ICD-10-CM | POA: Diagnosis not present

## 2021-05-08 DIAGNOSIS — X58XXXA Exposure to other specified factors, initial encounter: Secondary | ICD-10-CM | POA: Diagnosis not present

## 2021-05-08 DIAGNOSIS — Y939 Activity, unspecified: Secondary | ICD-10-CM | POA: Diagnosis not present

## 2021-05-08 DIAGNOSIS — M542 Cervicalgia: Secondary | ICD-10-CM | POA: Diagnosis present

## 2021-05-08 DIAGNOSIS — M62838 Other muscle spasm: Secondary | ICD-10-CM | POA: Diagnosis not present

## 2021-05-08 DIAGNOSIS — Y929 Unspecified place or not applicable: Secondary | ICD-10-CM | POA: Diagnosis not present
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
## 2021-05-14 ENCOUNTER — Telehealth (HOSPITAL_BASED_OUTPATIENT_CLINIC_OR_DEPARTMENT_OTHER): Payer: Self-pay | Admitting: Nurse Practitioner

## 2021-05-14 ENCOUNTER — Encounter (HOSPITAL_BASED_OUTPATIENT_CLINIC_OR_DEPARTMENT_OTHER): Payer: Self-pay | Admitting: Nurse Practitioner

## 2021-05-14 NOTE — Telephone Encounter (Signed)
Pt came by to drop off FMLA paperwork to be filled out. Pt already paid $25 filing fee. Paperwork is in providers [employer]. Please advise.

## 2021-05-15 ENCOUNTER — Encounter (HOSPITAL_BASED_OUTPATIENT_CLINIC_OR_DEPARTMENT_OTHER): Payer: Self-pay | Admitting: Physical Therapy

## 2021-05-15 ENCOUNTER — Encounter (HOSPITAL_BASED_OUTPATIENT_CLINIC_OR_DEPARTMENT_OTHER): Payer: Self-pay

## 2021-05-15 ENCOUNTER — Other Ambulatory Visit: Payer: Self-pay

## 2021-05-15 ENCOUNTER — Telehealth (HOSPITAL_BASED_OUTPATIENT_CLINIC_OR_DEPARTMENT_OTHER): Payer: Self-pay

## 2021-05-15 ENCOUNTER — Encounter (HOSPITAL_BASED_OUTPATIENT_CLINIC_OR_DEPARTMENT_OTHER): Payer: Self-pay | Admitting: Nurse Practitioner

## 2021-05-15 ENCOUNTER — Telehealth (HOSPITAL_BASED_OUTPATIENT_CLINIC_OR_DEPARTMENT_OTHER): Payer: Self-pay | Admitting: Nurse Practitioner

## 2021-05-15 ENCOUNTER — Ambulatory Visit (HOSPITAL_BASED_OUTPATIENT_CLINIC_OR_DEPARTMENT_OTHER): Payer: 59 | Admitting: Physical Therapy

## 2021-05-15 DIAGNOSIS — M62838 Other muscle spasm: Secondary | ICD-10-CM

## 2021-05-15 DIAGNOSIS — G8929 Other chronic pain: Secondary | ICD-10-CM | POA: Diagnosis not present

## 2021-05-15 DIAGNOSIS — M545 Low back pain, unspecified: Secondary | ICD-10-CM

## 2021-05-15 DIAGNOSIS — M542 Cervicalgia: Secondary | ICD-10-CM

## 2021-05-15 NOTE — Telephone Encounter (Signed)
FMLA paper work was completed and left at the front desk for the patient to pick up. I also at the patients request faxed FMLA to [phone].
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
## 2021-05-15 NOTE — Telephone Encounter (Signed)
Pt would like a call regarding the FMLA paperwork. Stated wanted to know what all test were released regarding paperwork. Pt would like a call regarding this matter asap. Please advise.

## 2021-05-16 ENCOUNTER — Telehealth (HOSPITAL_BASED_OUTPATIENT_CLINIC_OR_DEPARTMENT_OTHER): Payer: Self-pay

## 2021-05-16 DIAGNOSIS — I10 Essential (primary) hypertension: Secondary | ICD-10-CM

## 2021-05-16 MED ORDER — AMLODIPINE BESYLATE 10 MG PO TABS
10.0000 mg | ORAL_TABLET | Freq: Every day | ORAL | 2 refills | Status: DC
Start: 2021-05-16 — End: 2021-06-12

## 2021-05-16 MED ORDER — HYDROCHLOROTHIAZIDE 50 MG PO TABS: 50.0000 mg | ORAL_TABLET | Freq: Every day | ORAL | 2 refills | Status: DC

## 2021-05-16 NOTE — Telephone Encounter (Signed)
Spoke with patient regarding FMLA paper work, explained to her what was faxed as she was confused. She verbally understood.

## 2021-05-16 NOTE — Telephone Encounter (Signed)
Orders sent to pharmacy for increased dose of HCTZ to 50mg  and amlodipine to 10mg .

## 2021-05-17 ENCOUNTER — Ambulatory Visit (HOSPITAL_BASED_OUTPATIENT_CLINIC_OR_DEPARTMENT_OTHER): Payer: 59 | Attending: Nurse Practitioner | Admitting: Physical Therapy

## 2021-05-17 ENCOUNTER — Ambulatory Visit (HOSPITAL_BASED_OUTPATIENT_CLINIC_OR_DEPARTMENT_OTHER): Payer: 59 | Admitting: Physical Therapy

## 2021-05-17 ENCOUNTER — Other Ambulatory Visit: Payer: Self-pay

## 2021-05-17 DIAGNOSIS — M62838 Other muscle spasm: Secondary | ICD-10-CM | POA: Diagnosis present

## 2021-05-17 DIAGNOSIS — M542 Cervicalgia: Secondary | ICD-10-CM | POA: Diagnosis present

## 2021-05-17 DIAGNOSIS — G8929 Other chronic pain: Secondary | ICD-10-CM | POA: Diagnosis present

## 2021-05-17 DIAGNOSIS — M545 Low back pain, unspecified: Secondary | ICD-10-CM | POA: Insufficient documentation
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
## 2021-05-18 ENCOUNTER — Encounter (HOSPITAL_BASED_OUTPATIENT_CLINIC_OR_DEPARTMENT_OTHER): Payer: Self-pay | Admitting: Physical Therapy

## 2021-05-23 ENCOUNTER — Encounter (HOSPITAL_BASED_OUTPATIENT_CLINIC_OR_DEPARTMENT_OTHER): Payer: Self-pay | Admitting: Physical Therapy

## 2021-05-23 ENCOUNTER — Ambulatory Visit (HOSPITAL_BASED_OUTPATIENT_CLINIC_OR_DEPARTMENT_OTHER): Payer: 59 | Admitting: Physical Therapy

## 2021-05-23 ENCOUNTER — Other Ambulatory Visit: Payer: Self-pay

## 2021-05-23 DIAGNOSIS — M62838 Other muscle spasm: Secondary | ICD-10-CM

## 2021-05-23 DIAGNOSIS — M542 Cervicalgia: Secondary | ICD-10-CM | POA: Diagnosis not present

## 2021-05-23 DIAGNOSIS — G8929 Other chronic pain: Secondary | ICD-10-CM
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
## 2021-05-25 ENCOUNTER — Ambulatory Visit (HOSPITAL_BASED_OUTPATIENT_CLINIC_OR_DEPARTMENT_OTHER): Payer: 59 | Admitting: Physical Therapy

## 2021-05-25 ENCOUNTER — Other Ambulatory Visit: Payer: Self-pay

## 2021-05-25 ENCOUNTER — Encounter (HOSPITAL_BASED_OUTPATIENT_CLINIC_OR_DEPARTMENT_OTHER): Payer: Self-pay | Admitting: Physical Therapy

## 2021-05-25 DIAGNOSIS — G8929 Other chronic pain: Secondary | ICD-10-CM

## 2021-05-25 DIAGNOSIS — M62838 Other muscle spasm: Secondary | ICD-10-CM

## 2021-05-25 DIAGNOSIS — M542 Cervicalgia: Secondary | ICD-10-CM | POA: Diagnosis not present

## 2021-05-25 DIAGNOSIS — M545 Low back pain, unspecified: Secondary | ICD-10-CM
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
## 2021-06-07 ENCOUNTER — Encounter (HOSPITAL_BASED_OUTPATIENT_CLINIC_OR_DEPARTMENT_OTHER): Payer: Self-pay | Admitting: Physical Therapy

## 2021-06-07 ENCOUNTER — Other Ambulatory Visit: Payer: Self-pay

## 2021-06-07 ENCOUNTER — Ambulatory Visit (HOSPITAL_BASED_OUTPATIENT_CLINIC_OR_DEPARTMENT_OTHER): Payer: 59 | Admitting: Physical Therapy

## 2021-06-07 DIAGNOSIS — M542 Cervicalgia: Secondary | ICD-10-CM

## 2021-06-07 DIAGNOSIS — M545 Low back pain, unspecified: Secondary | ICD-10-CM

## 2021-06-07 DIAGNOSIS — M62838 Other muscle spasm: Secondary | ICD-10-CM
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
## 2021-06-12 ENCOUNTER — Encounter (HOSPITAL_BASED_OUTPATIENT_CLINIC_OR_DEPARTMENT_OTHER): Payer: Self-pay | Admitting: Nurse Practitioner

## 2021-06-12 ENCOUNTER — Ambulatory Visit (HOSPITAL_BASED_OUTPATIENT_CLINIC_OR_DEPARTMENT_OTHER): Payer: 59 | Admitting: Nurse Practitioner

## 2021-06-12 ENCOUNTER — Ambulatory Visit (INDEPENDENT_AMBULATORY_CARE_PROVIDER_SITE_OTHER): Payer: 59 | Admitting: Nurse Practitioner

## 2021-06-12 ENCOUNTER — Other Ambulatory Visit: Payer: Self-pay

## 2021-06-12 VITALS — BP 130/82 | HR 97 | Ht 65.0 in | Wt 190.0 lb

## 2021-06-12 DIAGNOSIS — F5101 Primary insomnia: Secondary | ICD-10-CM | POA: Diagnosis not present

## 2021-06-12 DIAGNOSIS — B379 Candidiasis, unspecified: Secondary | ICD-10-CM

## 2021-06-12 DIAGNOSIS — I10 Essential (primary) hypertension: Secondary | ICD-10-CM

## 2021-06-12 DIAGNOSIS — M5412 Radiculopathy, cervical region: Secondary | ICD-10-CM

## 2021-06-12 DIAGNOSIS — Z716 Tobacco abuse counseling: Secondary | ICD-10-CM

## 2021-06-12 DIAGNOSIS — Z720 Tobacco use: Secondary | ICD-10-CM

## 2021-06-12 MED ORDER — AMLODIPINE BESYLATE 10 MG PO TABS: 10.0000 mg | ORAL_TABLET | Freq: Every day | ORAL | 3 refills | Status: DC

## 2021-06-12 MED ORDER — CYCLOBENZAPRINE HCL 10 MG PO TABS: 10.0000 mg | ORAL_TABLET | Freq: Three times a day (TID) | ORAL | 0 refills | Status: DC | PRN

## 2021-06-12 MED ORDER — FLUCONAZOLE 150 MG PO TABS
ORAL_TABLET | ORAL | 6 refills | Status: DC
Start: 2021-06-12 — End: 2021-09-13

## 2021-06-12 MED ORDER — HYDROXYZINE HCL 50 MG PO TABS: 50.0000 mg | ORAL_TABLET | Freq: Every day | ORAL | 3 refills | Status: DC

## 2021-06-12 MED ORDER — HYDROCHLOROTHIAZIDE 50 MG PO TABS: 50.0000 mg | ORAL_TABLET | Freq: Every day | ORAL | 3 refills | Status: DC

## 2021-06-12 MED ORDER — OXYCODONE-ACETAMINOPHEN 5-325 MG PO TABS: 1.0000 | ORAL_TABLET | Freq: Four times a day (QID) | ORAL | 0 refills | Status: DC | PRN

## 2021-06-12 NOTE — Assessment & Plan Note (Signed)
Hydroxyzine working very well for her.  Will send in years supply and recommend continued use as needed.
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
## 2021-06-12 NOTE — Patient Instructions (Incomplete)
Recommendations from today's visit:  *** *** ***

## 2021-06-12 NOTE — Patient Instructions (Signed)
I have sent in refills of all of your medications for you including the flexeril, percocet, and hydrozyzine with your blood pressure medications.   I sent the order for the MRI they will call you to schedule this as soon as th get authorization.  Continue with PT.   Let me know if the pain changes or gets worse.
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
## 2021-06-12 NOTE — Progress Notes (Deleted)
Established Patient Office Visit  Subjective:  Patient ID: Hannah Lutz, female    DOB: Jun 18, 1980  Age: 40 y.o. MRN: [id number]  CC: No chief complaint on file.   HPI Hannah Lutz presents for follow-up for HTN.  She is currently managed with amlodipine 10m and HCTZ 523mdaily.  She *** taking the medication daily *** skipped doses.  She *** headaches, vision changes, palpitations, dizziness, CP, numbness, weakness, LE edema.  She *** smoking cigarettes at *** PPD. She *** wanting to quit at this time.     Past Medical History:  Diagnosis Date   Abscess of axilla, left 05/26/2019   Episodic cigarette smoking dependence 01/15/2019   Hemoglobinuria 01/26/2019   Hemoglobinuria 01/26/2019   History of dysuria 01/14/2019   History of trichomoniasis 01/14/2019   Hypertension    New daily persistent headache 10/18/2016   Pleuritic chest pain 08/19/2018   Right ear pain 04/29/2019   Sinus tachycardia by electrocardiogram 01/15/2019   Tobacco use    Trichomonal vaginitis 12/19/2017   Wears contact lenses 10/18/2016    Past Surgical History:  Procedure Laterality Date   CERVICAL CERCLAGE      Family History  Problem Relation Age of Onset   Breast cancer Mother [id number] Hypertension Father    Breast cancer Maternal Aunt    Breast cancer Maternal Grandmother [phone] Cancer Maternal Aunt     Social History   Socioeconomic History   Marital status: Single    Spouse name: Not on file   Number of children: Not on file   Years of education: Not on file   Highest education level: Not on file  Occupational History   Not on file  Tobacco Use   Smoking status: Some Days    Types: Cigarettes   Smokeless tobacco: Never   Tobacco comments:    1-3 cigs/day  Vaping Use   Vaping Use: Never used  Substance and Sexual Activity   Alcohol use: Yes    Alcohol/week: 14.0 standard drinks    Types: 14 Glasses of wine per week   Drug use: No   Sexual activity: Yes    Birth control/protection:  None  Other Topics Concern   Not on file  Social History Narrative   Not on file   Social Determinants of Health   Financial Resource Strain: Not on file  Food Insecurity: Not on file  Transportation Needs: Not on file  Physical Activity: Not on file  Stress: Not on file  Social Connections: Not on file  Intimate Partner Violence: Not on file    Outpatient Medications Prior to Visit  Medication Sig Dispense Refill   amLODipine (NORVASC) 10 MG tablet Take 1 tablet (10 mg total) by mouth daily. 30 tablet 2   cyclobenzaprine (FLEXERIL) 10 MG tablet Take 1 tablet (10 mg total) by mouth 3 (three) times daily as needed for muscle spasms. 30 tablet 0   fluconazole (DIFLUCAN) 150 MG tablet Take one tablet today. If symptoms are still present, take a second dose in 3 days. 2 tablet 6   hydrochlorothiazide (HYDRODIURIL) 50 MG tablet Take 1 tablet (50 mg total) by mouth daily. 30 tablet 2   hydrOXYzine (ATARAX) 50 MG tablet Take 50 mg by mouth at bedtime.     Lidocaine (HM LIDOCAINE PATCH) 4 % PTCH Apply 1 patch topically every 12 (twelve) hours as needed. 30 patch 0   NUVESSA 1.3 % GEL INSERT 1 APPLICTION VAGINALLY AT BEDTIME 5 g 3  oxyCODONE-acetaminophen (PERCOCET) 5-325 MG tablet Take 1-2 tablets by mouth every 6 (six) hours as needed for severe pain. Use the smallest dose possible to avoid tolerance/dependence 40 tablet 0   predniSONE (DELTASONE) 50 MG tablet Take 1 tablet (50 mg total) by mouth daily. 5 tablet 0   No facility-administered medications prior to visit.    Allergies  Allergen Reactions   Methocarbamol Itching   Candesartan Other (See Comments)    headache   Lisinopril Cough   Tramadol Nausea And Vomiting    ROS Review of Systems    Objective:    Physical Exam  There were no vitals taken for this visit. Wt Readings from Last 3 Encounters:  04/26/21 185 lb (83.9 kg)  04/23/21 180 lb (81.6 kg)  04/12/21 186 lb (84.4 kg)     Health Maintenance Due  Topic  Date Due   Pneumococcal Vaccine 15-33 Years old (1 - PCV) Never done   PAP SMEAR-Modifier  12/16/2020    There are no preventive care reminders to display for this patient.  No results found for: TSH Lab Results  Component Value Date   WBC 11.0 (H) 12/15/2020   HGB 13.3 12/15/2020   HCT 42.7 12/15/2020   MCV 79 12/15/2020   PLT 379 12/15/2020   Lab Results  Component Value Date   NA 139 12/15/2020   K 3.5 12/15/2020   CO2 25 12/15/2020   GLUCOSE 107 (H) 12/15/2020   BUN 18 12/15/2020   CREATININE 0.97 12/15/2020   BILITOT <0.2 12/15/2020   ALKPHOS 92 12/15/2020   AST 20 12/15/2020   ALT 21 12/15/2020   PROT 7.5 12/15/2020   ALBUMIN 4.2 12/15/2020   CALCIUM 9.1 12/15/2020   EGFR 75 12/15/2020   Lab Results  Component Value Date   CHOL 187 01/14/2019   Lab Results  Component Value Date   HDL 52 01/14/2019   Lab Results  Component Value Date   LDLCALC 112 (H) 01/14/2019   Lab Results  Component Value Date   TRIG 122 01/14/2019   Lab Results  Component Value Date   CHOLHDL 3.6 01/14/2019   Lab Results  Component Value Date   HGBA1C 5.7 (H) 12/15/2020      Assessment & Plan:   Problem List Items Addressed This Visit   None   No orders of the defined types were placed in this encounter.   Follow-up: No follow-ups on file.    Orma Render, NP

## 2021-06-12 NOTE — Assessment & Plan Note (Signed)
BP looks great today.  Tolerating medication well with no SE.  Last labs in July. We will plan for these at next visit.  No alarm symptoms present.  12 month refills provided today.

## 2021-06-12 NOTE — Assessment & Plan Note (Signed)
Smoking only when home and only 1-4 cigarettes a day.  She has been able to go several days without smoking.  She is ready to quit and plans to continue to try to stop.  Recommend that she try nicotine replacement gum instead of cigarettes when at home and find something to do with her hands to help with the habit. Once the habit is broke, she will likely be able to stop the nicotine addiction given the limited amount that she smokes a day.  I know she can do this.

## 2021-06-12 NOTE — Assessment & Plan Note (Signed)
Continued right sided cervical neck pain with paresthesias into right arm.  Spasming present to cervical spine with tenderness to muscle on gentle manipulation. ROM decreased horizontally to 20 degrees with passive ROM. Arm ROM has improved, but limited with spasms in the trapezius and shoulder.  Will order cervical MRI today to evaluate for possible impingement.  Strongly suspect spasms are contributing to the pain, as well, but concern is present with the radicular pain present and limited ROM despite weeks of PT.  Recommend continue PT and stretches at home.  Recommend sleeping with proper alignment on pillow and avoid keeping head bent or twisted during sleep or at work to prevent worsening symptoms.  Will refill flexeril for spasms and percocet for pain- she is only taking these on limited bases at night time for severe pain.  Consider gabapentin for pain in the future, if continued use is needed.  Will determine next steps once MRI results have been received.

## 2021-06-13 ENCOUNTER — Ambulatory Visit (HOSPITAL_BASED_OUTPATIENT_CLINIC_OR_DEPARTMENT_OTHER): Payer: 59 | Admitting: Physical Therapy

## 2021-06-13 ENCOUNTER — Encounter (HOSPITAL_BASED_OUTPATIENT_CLINIC_OR_DEPARTMENT_OTHER): Payer: Self-pay | Admitting: Physical Therapy

## 2021-06-13 DIAGNOSIS — M62838 Other muscle spasm: Secondary | ICD-10-CM

## 2021-06-13 DIAGNOSIS — M542 Cervicalgia: Secondary | ICD-10-CM

## 2021-06-13 DIAGNOSIS — G8929 Other chronic pain: Secondary | ICD-10-CM
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
## 2021-06-15 ENCOUNTER — Ambulatory Visit (HOSPITAL_BASED_OUTPATIENT_CLINIC_OR_DEPARTMENT_OTHER): Payer: 59 | Admitting: Physical Therapy

## 2021-06-15 ENCOUNTER — Other Ambulatory Visit: Payer: Self-pay

## 2021-06-15 ENCOUNTER — Encounter (HOSPITAL_BASED_OUTPATIENT_CLINIC_OR_DEPARTMENT_OTHER): Payer: Self-pay | Admitting: Physical Therapy

## 2021-06-15 DIAGNOSIS — M62838 Other muscle spasm: Secondary | ICD-10-CM

## 2021-06-15 DIAGNOSIS — M542 Cervicalgia: Secondary | ICD-10-CM

## 2021-06-15 DIAGNOSIS — M545 Low back pain, unspecified: Secondary | ICD-10-CM
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
## 2021-06-19 ENCOUNTER — Ambulatory Visit (HOSPITAL_BASED_OUTPATIENT_CLINIC_OR_DEPARTMENT_OTHER): Payer: 59 | Admitting: Nurse Practitioner

## 2021-06-19 ENCOUNTER — Ambulatory Visit (HOSPITAL_BASED_OUTPATIENT_CLINIC_OR_DEPARTMENT_OTHER): Payer: 59 | Attending: Nurse Practitioner | Admitting: Physical Therapy

## 2021-06-19 ENCOUNTER — Other Ambulatory Visit: Payer: Self-pay

## 2021-06-19 ENCOUNTER — Encounter (HOSPITAL_BASED_OUTPATIENT_CLINIC_OR_DEPARTMENT_OTHER): Payer: Self-pay | Admitting: Physical Therapy

## 2021-06-19 DIAGNOSIS — M542 Cervicalgia: Secondary | ICD-10-CM

## 2021-06-19 DIAGNOSIS — G8929 Other chronic pain: Secondary | ICD-10-CM | POA: Diagnosis present

## 2021-06-19 DIAGNOSIS — M62838 Other muscle spasm: Secondary | ICD-10-CM | POA: Diagnosis present

## 2021-06-19 DIAGNOSIS — M545 Low back pain, unspecified: Secondary | ICD-10-CM | POA: Insufficient documentation
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
## 2021-06-20 ENCOUNTER — Encounter (HOSPITAL_BASED_OUTPATIENT_CLINIC_OR_DEPARTMENT_OTHER): Payer: Self-pay | Admitting: Physical Therapy

## 2021-06-21 ENCOUNTER — Encounter (HOSPITAL_BASED_OUTPATIENT_CLINIC_OR_DEPARTMENT_OTHER): Payer: Self-pay | Admitting: Physical Therapy

## 2021-06-28 ENCOUNTER — Encounter (HOSPITAL_BASED_OUTPATIENT_CLINIC_OR_DEPARTMENT_OTHER): Payer: 59 | Admitting: Physical Therapy

## 2021-07-04 ENCOUNTER — Encounter (HOSPITAL_BASED_OUTPATIENT_CLINIC_OR_DEPARTMENT_OTHER): Payer: 59 | Admitting: Physical Therapy

## 2021-07-05 ENCOUNTER — Other Ambulatory Visit: Payer: Self-pay

## 2021-07-05 ENCOUNTER — Encounter (HOSPITAL_BASED_OUTPATIENT_CLINIC_OR_DEPARTMENT_OTHER): Payer: Self-pay | Admitting: Physical Therapy

## 2021-07-05 ENCOUNTER — Ambulatory Visit (HOSPITAL_BASED_OUTPATIENT_CLINIC_OR_DEPARTMENT_OTHER): Payer: 59 | Admitting: Physical Therapy

## 2021-07-05 DIAGNOSIS — M542 Cervicalgia: Secondary | ICD-10-CM

## 2021-07-05 DIAGNOSIS — M62838 Other muscle spasm: Secondary | ICD-10-CM

## 2021-07-05 DIAGNOSIS — G8929 Other chronic pain: Secondary | ICD-10-CM
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
## 2021-07-09 ENCOUNTER — Encounter (HOSPITAL_BASED_OUTPATIENT_CLINIC_OR_DEPARTMENT_OTHER): Payer: Self-pay | Admitting: Physical Therapy

## 2021-07-09 ENCOUNTER — Other Ambulatory Visit: Payer: Self-pay

## 2021-07-09 ENCOUNTER — Ambulatory Visit (HOSPITAL_BASED_OUTPATIENT_CLINIC_OR_DEPARTMENT_OTHER): Payer: 59 | Admitting: Physical Therapy

## 2021-07-09 DIAGNOSIS — M542 Cervicalgia: Secondary | ICD-10-CM

## 2021-07-09 DIAGNOSIS — M545 Low back pain, unspecified: Secondary | ICD-10-CM

## 2021-07-09 DIAGNOSIS — M62838 Other muscle spasm: Secondary | ICD-10-CM
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
## 2021-07-11 ENCOUNTER — Ambulatory Visit (HOSPITAL_BASED_OUTPATIENT_CLINIC_OR_DEPARTMENT_OTHER): Payer: 59 | Admitting: Physical Therapy

## 2021-07-17 ENCOUNTER — Other Ambulatory Visit: Payer: Self-pay

## 2021-07-17 ENCOUNTER — Ambulatory Visit
Admission: RE | Admit: 2021-07-17 | Discharge: 2021-07-17 | Disposition: A | Discharge status: Home / Self Care | Payer: 59 | Source: Ambulatory Visit | Attending: Nurse Practitioner | Admitting: Nurse Practitioner

## 2021-07-17 DIAGNOSIS — M5412 Radiculopathy, cervical region: Secondary | ICD-10-CM

## 2021-07-30 ENCOUNTER — Other Ambulatory Visit: Payer: Self-pay | Admitting: General Surgery

## 2021-07-30 ENCOUNTER — Encounter (HOSPITAL_BASED_OUTPATIENT_CLINIC_OR_DEPARTMENT_OTHER): Payer: Self-pay | Admitting: Physical Therapy

## 2021-07-30 ENCOUNTER — Ambulatory Visit (HOSPITAL_BASED_OUTPATIENT_CLINIC_OR_DEPARTMENT_OTHER): Payer: 59 | Attending: Nurse Practitioner | Admitting: Physical Therapy

## 2021-07-30 ENCOUNTER — Other Ambulatory Visit: Payer: Self-pay

## 2021-07-30 ENCOUNTER — Ambulatory Visit
Admission: RE | Admit: 2021-07-30 | Discharge: 2021-07-30 | Disposition: A | Discharge status: Home / Self Care | Payer: 59 | Source: Ambulatory Visit | Attending: General Surgery | Admitting: General Surgery

## 2021-07-30 DIAGNOSIS — M542 Cervicalgia: Secondary | ICD-10-CM | POA: Insufficient documentation

## 2021-07-30 DIAGNOSIS — G8929 Other chronic pain: Secondary | ICD-10-CM | POA: Insufficient documentation

## 2021-07-30 DIAGNOSIS — M545 Low back pain, unspecified: Secondary | ICD-10-CM | POA: Insufficient documentation

## 2021-07-30 DIAGNOSIS — M62838 Other muscle spasm: Secondary | ICD-10-CM | POA: Insufficient documentation

## 2021-07-30 DIAGNOSIS — N6320 Unspecified lump in the left breast, unspecified quadrant: Secondary | ICD-10-CM
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
## 2021-08-03 ENCOUNTER — Encounter (HOSPITAL_BASED_OUTPATIENT_CLINIC_OR_DEPARTMENT_OTHER): Payer: Self-pay | Admitting: Physical Therapy

## 2021-08-03 ENCOUNTER — Other Ambulatory Visit: Payer: Self-pay

## 2021-08-03 ENCOUNTER — Ambulatory Visit (HOSPITAL_BASED_OUTPATIENT_CLINIC_OR_DEPARTMENT_OTHER): Payer: 59 | Admitting: Physical Therapy

## 2021-08-03 DIAGNOSIS — M545 Low back pain, unspecified: Secondary | ICD-10-CM

## 2021-08-03 DIAGNOSIS — G8929 Other chronic pain: Secondary | ICD-10-CM

## 2021-08-03 DIAGNOSIS — M542 Cervicalgia: Secondary | ICD-10-CM | POA: Diagnosis not present

## 2021-08-03 DIAGNOSIS — M62838 Other muscle spasm: Secondary | ICD-10-CM
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
## 2021-08-06 ENCOUNTER — Ambulatory Visit (HOSPITAL_BASED_OUTPATIENT_CLINIC_OR_DEPARTMENT_OTHER): Payer: 59 | Admitting: Physical Therapy

## 2021-08-08 ENCOUNTER — Ambulatory Visit (HOSPITAL_BASED_OUTPATIENT_CLINIC_OR_DEPARTMENT_OTHER): Payer: 59 | Admitting: Physical Therapy

## 2021-08-08 ENCOUNTER — Other Ambulatory Visit: Payer: Self-pay

## 2021-08-08 DIAGNOSIS — M545 Low back pain, unspecified: Secondary | ICD-10-CM

## 2021-08-08 DIAGNOSIS — M62838 Other muscle spasm: Secondary | ICD-10-CM

## 2021-08-08 DIAGNOSIS — M542 Cervicalgia: Secondary | ICD-10-CM | POA: Diagnosis not present
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
## 2021-08-09 ENCOUNTER — Encounter (HOSPITAL_BASED_OUTPATIENT_CLINIC_OR_DEPARTMENT_OTHER): Payer: Self-pay | Admitting: Physical Therapy

## 2021-08-13 ENCOUNTER — Other Ambulatory Visit: Payer: Self-pay

## 2021-08-13 ENCOUNTER — Ambulatory Visit (HOSPITAL_BASED_OUTPATIENT_CLINIC_OR_DEPARTMENT_OTHER): Payer: 59 | Admitting: Physical Therapy

## 2021-08-13 ENCOUNTER — Encounter (HOSPITAL_BASED_OUTPATIENT_CLINIC_OR_DEPARTMENT_OTHER): Payer: Self-pay | Admitting: Physical Therapy

## 2021-08-13 DIAGNOSIS — M542 Cervicalgia: Secondary | ICD-10-CM | POA: Diagnosis not present

## 2021-08-13 DIAGNOSIS — G8929 Other chronic pain: Secondary | ICD-10-CM

## 2021-08-13 DIAGNOSIS — M62838 Other muscle spasm: Secondary | ICD-10-CM

## 2021-08-13 DIAGNOSIS — M545 Low back pain, unspecified: Secondary | ICD-10-CM
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
## 2021-08-14 ENCOUNTER — Encounter (HOSPITAL_BASED_OUTPATIENT_CLINIC_OR_DEPARTMENT_OTHER): Payer: Self-pay | Admitting: Physical Therapy

## 2021-08-15 ENCOUNTER — Ambulatory Visit (HOSPITAL_BASED_OUTPATIENT_CLINIC_OR_DEPARTMENT_OTHER): Payer: 59 | Admitting: Physical Therapy

## 2021-08-15 ENCOUNTER — Ambulatory Visit
Admission: RE | Admit: 2021-08-15 | Discharge: 2021-08-15 | Disposition: A | Discharge status: Home / Self Care | Payer: 59 | Source: Ambulatory Visit | Attending: General Surgery | Admitting: General Surgery

## 2021-08-15 ENCOUNTER — Other Ambulatory Visit: Payer: Self-pay

## 2021-08-15 DIAGNOSIS — N6320 Unspecified lump in the left breast, unspecified quadrant: Secondary | ICD-10-CM

## 2021-09-13 ENCOUNTER — Ambulatory Visit (INDEPENDENT_AMBULATORY_CARE_PROVIDER_SITE_OTHER): Payer: 59 | Admitting: Nurse Practitioner

## 2021-09-13 ENCOUNTER — Encounter (HOSPITAL_BASED_OUTPATIENT_CLINIC_OR_DEPARTMENT_OTHER): Payer: Self-pay | Admitting: Nurse Practitioner

## 2021-09-13 VITALS — BP 128/82 | HR 97 | Temp 98.7°F | Ht 66.0 in | Wt 186.2 lb

## 2021-09-13 DIAGNOSIS — M5412 Radiculopathy, cervical region: Secondary | ICD-10-CM | POA: Diagnosis not present

## 2021-09-13 DIAGNOSIS — I10 Essential (primary) hypertension: Secondary | ICD-10-CM | POA: Diagnosis not present

## 2021-09-13 DIAGNOSIS — B379 Candidiasis, unspecified: Secondary | ICD-10-CM

## 2021-09-13 DIAGNOSIS — Z0000 Encounter for general adult medical examination without abnormal findings: Secondary | ICD-10-CM | POA: Diagnosis not present

## 2021-09-13 DIAGNOSIS — N898 Other specified noninflammatory disorders of vagina: Secondary | ICD-10-CM

## 2021-09-13 MED ORDER — OXYCODONE-ACETAMINOPHEN 5-325 MG PO TABS: 1.0000 | ORAL_TABLET | Freq: Four times a day (QID) | ORAL | 0 refills | Status: DC | PRN

## 2021-09-13 MED ORDER — AMLODIPINE BESYLATE 10 MG PO TABS: 10.0000 mg | ORAL_TABLET | Freq: Every day | ORAL | 3 refills | Status: DC

## 2021-09-13 MED ORDER — CYCLOBENZAPRINE HCL 10 MG PO TABS: 10.0000 mg | ORAL_TABLET | Freq: Three times a day (TID) | ORAL | 0 refills | Status: DC | PRN

## 2021-09-13 MED ORDER — FLUCONAZOLE 150 MG PO TABS: ORAL_TABLET | ORAL | 6 refills | Status: DC

## 2021-09-13 MED ORDER — HYDROCHLOROTHIAZIDE 50 MG PO TABS: 50.0000 mg | ORAL_TABLET | Freq: Every day | ORAL | 3 refills | Status: DC
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
## 2021-09-13 NOTE — Patient Instructions (Signed)
It was a pleasure seeing you today. I hope your time spent with [place] was pleasant and helpful. Please let us know if there is anything we can do to improve the service you receive.  ? ?Today we discussed concerns with: ? ?Candida infection ? ?Hypertension goal BP (blood pressure) < 130/80 ? ?Cervical radicular pain ? ?Vaginal irritation ? ?Encounter for annual physical exam ? ? ?The following orders have been placed for you today: ? ?Orders Placed This Encounter  ?Procedures  ? WET PREP FOR TRICH, YEAST, CLUE  ? Ct, Ng, Mycoplasmas NAA, Urine  ?  Order Specific Question:   Release to patient  ?  Answer:   Immediate  ? ? ? ?Important Office Information ?Lab Results ?If labs were ordered, please note that you will see results through MyChart as soon as they come available from [facility].  ?It takes up to 5 business days for the results to be routed to me and for me to review them once all of the lab results have come through from [facility]. I will make recommendations based on your results and send these through MyChart or someone from the office will call you to discuss. If your labs are abnormal, we may contact you to schedule a visit to discuss the results and make recommendations.  ?If you have not heard from [place] within 5 business days or you have waited longer than a week and your lab results have not come through on MyChart, please feel free to call the office or send a message through MyChart to follow-up on these labs.  ? ?Referrals ?If referrals were placed today, the office where the referral was sent will contact you either by phone or through MyChart to set up scheduling. Please note that it can take up to a week for the referral office to contact you. If you do not hear from them in a week, please contact the referral office directly to inquire about scheduling.  ? ?Condition Treated ?If your condition worsens or you begin to have new symptoms, please schedule a follow-up appointment for further evaluation. If  you are not sure if an appointment is needed, you may call the office to leave a message for the nurse and someone will contact you with recommendations.  ?If you have an urgent or life threatening emergency, please do not call the office, but seek emergency evaluation by calling 911 or going to the nearest emergency room for evaluation.  ? ?MyChart and Phone Calls ?Please do not use MyChart for urgent messages. It may take up to 3 business days for MyChart messages to be read by staff and if they are unable to handle the request, an additional 3 business days for them to be routed to me and for my response.  ?Messages sent to the provider through MyChart do not come directly to the provider, please allow time for these messages to be routed and for me to respond.  ?We get a large volume of MyChart messages daily and these are responded to in the order received.  ? ?For urgent messages, please call the office at [phone] and speak with the front office staff or leave a message on the line of my assistant for guidance.  ?We are seeing patients from the hours of 8:00 am through 5:00 pm and calls directly to the nurse may not be answered immediately due to seeing patients, but your call will be returned as soon as possible.  ?Phone  messages received after 4:00  PM Monday through Thursday may not be returned until the following business day. Phone messages received after 11:00 AM on Friday may not be returned until Monday.  ? ?After Hours ?We share on call hours with providers from other offices. If you have an urgent need after hours that cannot wait until the next business day, please contact the on call provider by calling the office number. A nurse will speak with you and contact the provider if needed for recommendations.  ?If you have an urgent or life threatening emergency after hours, please do not call the on call provider, but seek emergency evaluation by calling 911 or going to the nearest emergency room  for evaluation.  ? ?Paperwork ?All paperwork requires a minimum of 5 days to complete and return to you or the designated personnel. Please keep this in mind when bringing in forms or sending requests for paperwork completion to the office.  ?  ?

## 2021-09-14 ENCOUNTER — Other Ambulatory Visit (HOSPITAL_BASED_OUTPATIENT_CLINIC_OR_DEPARTMENT_OTHER): Payer: Self-pay | Admitting: Nurse Practitioner

## 2021-09-14 DIAGNOSIS — E876 Hypokalemia: Secondary | ICD-10-CM

## 2021-09-14 DIAGNOSIS — E782 Mixed hyperlipidemia: Secondary | ICD-10-CM

## 2021-09-14 DIAGNOSIS — E559 Vitamin D deficiency, unspecified: Secondary | ICD-10-CM

## 2021-09-14 LAB — CBC WITH DIFFERENTIAL/PLATELET
Basophils Absolute: 0.1 10*3/uL (ref 0.0–0.2)
Basos: 1 %
EOS (ABSOLUTE): 0.3 10*3/uL (ref 0.0–0.4)
Eos: 4 %
Hematocrit: 46.8 % — ABNORMAL HIGH (ref 34.0–46.6)
Hemoglobin: 15.1 g/dL (ref 11.1–15.9)
Immature Grans (Abs): 0 10*3/uL (ref 0.0–0.1)
Immature Granulocytes: 0 %
Lymphocytes Absolute: 1.6 10*3/uL (ref 0.7–3.1)
Lymphs: 21 %
MCH: 26 pg — ABNORMAL LOW (ref 26.6–33.0)
MCHC: 32.3 g/dL (ref 31.5–35.7)
MCV: 81 fL (ref 79–97)
Monocytes Absolute: 0.9 10*3/uL (ref 0.1–0.9)
Monocytes: 12 %
Neutrophils Absolute: 4.7 10*3/uL (ref 1.4–7.0)
Neutrophils: 62 %
Platelets: 370 10*3/uL (ref 150–450)
RBC: 5.81 x10E6/uL — ABNORMAL HIGH (ref 3.77–5.28)
RDW: 15.1 % (ref 11.7–15.4)
WBC: 7.6 10*3/uL (ref 3.4–10.8)

## 2021-09-14 LAB — LIPID PANEL
Chol/HDL Ratio: 3.6 ratio (ref 0.0–4.4)
Cholesterol, Total: 199 mg/dL (ref 100–199)
HDL: 55 mg/dL (ref >39)
LDL Chol Calc (NIH): 112 mg/dL — ABNORMAL HIGH (ref 0–99)
Triglycerides: 186 mg/dL — ABNORMAL HIGH (ref 0–149)
VLDL Cholesterol Cal: 32 mg/dL (ref 5–40)

## 2021-09-14 LAB — COMPREHENSIVE METABOLIC PANEL
ALT: 41 IU/L — ABNORMAL HIGH (ref 0–32)
AST: 49 IU/L — ABNORMAL HIGH (ref 0–40)
Albumin/Globulin Ratio: 1.2 (ref 1.2–2.2)
Albumin: 4.4 g/dL (ref 3.8–4.8)
Alkaline Phosphatase: 103 IU/L (ref 44–121)
BUN/Creatinine Ratio: 6 — ABNORMAL LOW (ref 9–23)
BUN: 6 mg/dL (ref 6–24)
Bilirubin Total: 0.4 mg/dL (ref 0.0–1.2)
CO2: 24 mmol/L (ref 20–29)
Calcium: 9.8 mg/dL (ref 8.7–10.2)
Chloride: 97 mmol/L (ref 96–106)
Creatinine, Ser: 0.93 mg/dL (ref 0.57–1.00)
Globulin, Total: 3.6 g/dL (ref 1.5–4.5)
Glucose: 106 mg/dL — ABNORMAL HIGH (ref 70–99)
Potassium: 3.2 mmol/L — ABNORMAL LOW (ref 3.5–5.2)
Sodium: 137 mmol/L (ref 134–144)
Total Protein: 8 g/dL (ref 6.0–8.5)
eGFR: 78 mL/min/{1.73_m2} (ref >59)

## 2021-09-14 LAB — VITAMIN D 25 HYDROXY (VIT D DEFICIENCY, FRACTURES): Vit D, 25-Hydroxy: 6.2 ng/mL — ABNORMAL LOW (ref 30.0–100.0)

## 2021-09-14 LAB — HEMOGLOBIN A1C
Est. average glucose Bld gHb Est-mCnc: 111 mg/dL
Hgb A1c MFr Bld: 5.5 % (ref 4.8–5.6)

## 2021-09-14 MED ORDER — VITAMIN D (ERGOCALCIFEROL) 1.25 MG (50000 UNIT) PO CAPS: 50000.0000 [IU] | ORAL_CAPSULE | ORAL | 0 refills | Status: DC

## 2021-09-17 NOTE — Progress Notes (Signed)
LVM for pt to call back to schedule vitamin d, cbc, and lipids in 4 months- labs only. ?

## 2021-09-18 ENCOUNTER — Encounter (HOSPITAL_BASED_OUTPATIENT_CLINIC_OR_DEPARTMENT_OTHER): Payer: Self-pay | Admitting: Nurse Practitioner

## 2021-09-18 LAB — CT, NG, MYCOPLASMAS NAA, URINE

## 2021-09-19 DIAGNOSIS — N898 Other specified noninflammatory disorders of vagina: Secondary | ICD-10-CM | POA: Insufficient documentation

## 2021-09-19 NOTE — Assessment & Plan Note (Signed)
Chronic recurrent BV and candida infections. Will obtain labs today. No alarm symptoms present. She did have a new sexual partner, therefore STI testing will be done.  ?Discussed boric acid suppository use for at home treatment.  ?F/U if sx worsen or fail to improve.  ?

## 2021-09-19 NOTE — Assessment & Plan Note (Signed)
Chronic. Well controlled at this time.  ?Recommend complete smoking cessation for optimal control- she is doing well with cutting back.  ?Continue current medications. Will obtain labs today.  ?No concerning findings on exam.  ? ?

## 2021-09-19 NOTE — Assessment & Plan Note (Signed)
CPE today with no concerning findings.  ?STI testing performed.  ?Will obtain labs for complete evaluation.  ?Recommend f/u in 1 year or sooner if needed.  ?

## 2021-09-19 NOTE — Assessment & Plan Note (Signed)
Chronic. PRN use of hydrocodone for pain management when severe. Not taking daily.  ?Recommend using the least amount possible for pain control. No alarm symptoms present at this time.  ?Refills provided. PDMP reviewed.  ?

## 2021-09-24 ENCOUNTER — Other Ambulatory Visit (HOSPITAL_BASED_OUTPATIENT_CLINIC_OR_DEPARTMENT_OTHER): Payer: Self-pay

## 2021-09-24 NOTE — Progress Notes (Unsigned)
n

## 2021-10-03 ENCOUNTER — Other Ambulatory Visit (HOSPITAL_BASED_OUTPATIENT_CLINIC_OR_DEPARTMENT_OTHER): Payer: Self-pay | Admitting: Nurse Practitioner

## 2021-10-03 ENCOUNTER — Other Ambulatory Visit (HOSPITAL_BASED_OUTPATIENT_CLINIC_OR_DEPARTMENT_OTHER): Payer: Self-pay

## 2021-10-03 ENCOUNTER — Encounter (HOSPITAL_BASED_OUTPATIENT_CLINIC_OR_DEPARTMENT_OTHER): Payer: Self-pay | Admitting: Physical Therapy

## 2021-10-03 ENCOUNTER — Ambulatory Visit (HOSPITAL_BASED_OUTPATIENT_CLINIC_OR_DEPARTMENT_OTHER): Payer: 59 | Attending: Nurse Practitioner | Admitting: Physical Therapy

## 2021-10-03 ENCOUNTER — Encounter (HOSPITAL_BASED_OUTPATIENT_CLINIC_OR_DEPARTMENT_OTHER): Payer: Self-pay

## 2021-10-03 DIAGNOSIS — M542 Cervicalgia: Secondary | ICD-10-CM | POA: Insufficient documentation

## 2021-10-03 DIAGNOSIS — E559 Vitamin D deficiency, unspecified: Secondary | ICD-10-CM

## 2021-10-03 DIAGNOSIS — M545 Low back pain, unspecified: Secondary | ICD-10-CM | POA: Insufficient documentation

## 2021-10-03 DIAGNOSIS — M62838 Other muscle spasm: Secondary | ICD-10-CM | POA: Insufficient documentation

## 2021-10-03 DIAGNOSIS — M5412 Radiculopathy, cervical region: Secondary | ICD-10-CM

## 2021-10-03 DIAGNOSIS — I10 Essential (primary) hypertension: Secondary | ICD-10-CM

## 2021-10-03 DIAGNOSIS — G8929 Other chronic pain: Secondary | ICD-10-CM | POA: Insufficient documentation

## 2021-10-03 MED ORDER — VITAMIN D (ERGOCALCIFEROL) 1.25 MG (50000 UNIT) PO CAPS: 50000.0000 [IU] | ORAL_CAPSULE | ORAL | 0 refills | Status: DC

## 2021-10-03 MED ORDER — HYDROCHLOROTHIAZIDE 50 MG PO TABS: 50.0000 mg | ORAL_TABLET | Freq: Every day | ORAL | 3 refills | Status: DC

## 2021-10-03 MED ORDER — AMLODIPINE BESYLATE 10 MG PO TABS: 10.0000 mg | ORAL_TABLET | Freq: Every day | ORAL | 3 refills | Status: DC

## 2021-10-03 NOTE — Therapy (Addendum)
Patient came in for re-eval. She reports she has had very little pain recently. Her primary care would like her to try dry needling 2nd to continued tightness. The patient reports that needling makes her very nervous. She would like to not do it if she could. Therapy assessed her motion. Her rotation is 70 degrees bilateral. She has a significant improvement in tenderness to palpation of the upper traps. She will continue her exercises on her own. We will hold PT for now.   PHYSICAL THERAPY DISCHARGE SUMMARY  Visits from Start of Care: 15  Current functional level related to goals / functional outcomes: Improved pain   Remaining deficits: Pain at times    Education / Equipment: HEP   Patient agrees to discharge. Patient goals were partially met. Patient is being discharged due to being pleased with the current functional level.

## 2021-10-08 ENCOUNTER — Encounter (HOSPITAL_BASED_OUTPATIENT_CLINIC_OR_DEPARTMENT_OTHER): Payer: Self-pay | Admitting: Nurse Practitioner

## 2021-10-08 LAB — CT, NG, MYCOPLASMAS NAA, URINE
Chlamydia trachomatis, NAA: NEGATIVE
Mycoplasma genitalium NAA: NEGATIVE
Mycoplasma hominis NAA: NEGATIVE
Neisseria gonorrhoeae, NAA: NEGATIVE
Ureaplasma spp NAA: POSITIVE — AB

## 2021-10-09 ENCOUNTER — Other Ambulatory Visit (HOSPITAL_BASED_OUTPATIENT_CLINIC_OR_DEPARTMENT_OTHER): Payer: Self-pay

## 2021-10-09 ENCOUNTER — Encounter (HOSPITAL_BASED_OUTPATIENT_CLINIC_OR_DEPARTMENT_OTHER): Payer: Self-pay | Admitting: Nurse Practitioner

## 2021-10-09 DIAGNOSIS — B379 Candidiasis, unspecified: Secondary | ICD-10-CM

## 2021-10-09 DIAGNOSIS — N760 Acute vaginitis: Secondary | ICD-10-CM

## 2021-10-09 MED ORDER — METRONIDAZOLE 0.75 % VA GEL: 1.0000 | Freq: Every day | VAGINAL | 0 refills | Status: DC

## 2021-10-09 MED ORDER — DOXYCYCLINE HYCLATE 100 MG PO TABS: 100.0000 mg | ORAL_TABLET | Freq: Two times a day (BID) | ORAL | 0 refills | Status: AC

## 2022-02-25 ENCOUNTER — Encounter (HOSPITAL_BASED_OUTPATIENT_CLINIC_OR_DEPARTMENT_OTHER): Payer: Self-pay | Admitting: Nurse Practitioner

## 2022-02-25 ENCOUNTER — Ambulatory Visit (INDEPENDENT_AMBULATORY_CARE_PROVIDER_SITE_OTHER): Payer: 59 | Admitting: Nurse Practitioner

## 2022-02-25 DIAGNOSIS — M546 Pain in thoracic spine: Secondary | ICD-10-CM

## 2022-02-25 DIAGNOSIS — M5412 Radiculopathy, cervical region: Secondary | ICD-10-CM | POA: Diagnosis not present

## 2022-02-25 DIAGNOSIS — E559 Vitamin D deficiency, unspecified: Secondary | ICD-10-CM

## 2022-02-25 MED ORDER — CYCLOBENZAPRINE HCL 10 MG PO TABS: 10.0000 mg | ORAL_TABLET | Freq: Three times a day (TID) | ORAL | 3 refills | Status: DC | PRN

## 2022-02-25 MED ORDER — PREDNISONE 20 MG PO TABS: 40.0000 mg | ORAL_TABLET | Freq: Every day | ORAL | 0 refills | Status: DC

## 2022-02-25 MED ORDER — OXYCODONE-ACETAMINOPHEN 5-325 MG PO TABS: 1.0000 | ORAL_TABLET | ORAL | 0 refills | Status: DC | PRN

## 2022-02-25 MED ORDER — VITAMIN D (ERGOCALCIFEROL) 1.25 MG (50000 UNIT) PO CAPS: 50000.0000 [IU] | ORAL_CAPSULE | ORAL | 0 refills | Status: DC
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
## 2022-03-16 DIAGNOSIS — M546 Pain in thoracic spine: Secondary | ICD-10-CM | POA: Insufficient documentation
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
## 2022-06-28 ENCOUNTER — Encounter: Payer: Self-pay | Admitting: Internal Medicine

## 2022-11-07 ENCOUNTER — Ambulatory Visit: Payer: 59 | Admitting: Nurse Practitioner

## 2022-11-07 ENCOUNTER — Encounter: Payer: Self-pay | Admitting: Nurse Practitioner

## 2022-11-07 VITALS — BP 124/82 | HR 96 | Wt 194.0 lb

## 2022-11-07 DIAGNOSIS — Z7184 Encounter for health counseling related to travel: Secondary | ICD-10-CM

## 2022-11-07 DIAGNOSIS — Z23 Encounter for immunization: Secondary | ICD-10-CM | POA: Diagnosis not present

## 2022-11-07 MED ORDER — ATOVAQUONE-PROGUANIL HCL 250-100 MG PO TABS: 1.0000 | ORAL_TABLET | Freq: Every day | ORAL | 0 refills | Status: DC

## 2022-11-07 MED ORDER — AZITHROMYCIN 250 MG PO TABS: ORAL_TABLET | ORAL | 0 refills | Status: AC

## 2022-11-07 MED ORDER — METRONIDAZOLE 500 MG PO TABS: 500.0000 mg | ORAL_TABLET | Freq: Three times a day (TID) | ORAL | 0 refills | Status: DC

## 2022-11-07 NOTE — Patient Instructions (Addendum)
Tdap vaccine given 2021, so you are good.   You are getting Polio today.   Take Imodium and Pepto Bismol with you.   Take Pepto before every meal. Take Imodium only if you end up with diarrhea.   I have sent Metronidazole and Azithromycin to the pharmacy in case you get diarrhea.   I have sent Malarone for protection from Malaria. Start this 2 days before you leave, take every day while you are there, and take 7 days after you get back.

## 2022-11-07 NOTE — Progress Notes (Signed)
  Tollie Eth, DNP, AGNP-c [facility] [address] [place], [place] [address] [phone]  Subjective:   Hannah Lutz is a 42 y.o. female presents to day for evaluation of: Travel Medicine Hannah Lutz discusses her upcoming trip to [place], detailing the costs associated with the necessary medical consultations and vaccinations for yellow fever and other potential health risks like upset stomach and malaria. She shares her concerns about food and water safety while traveling, emphasizing the advice she received to only consume hot, boiled foods and to avoid unfiltered water. Hannah Lutz plans to take preventive medications for traveler's diarrhea and malaria.  Additionally, Hannah Lutz mentions a recent change in her menstrual cycle and discusses options for managing her period during her trip with oral contraceptives to avoid discomfort while traveling. She notes a past medical history of bacterial vaginosis but reports no recent incidents.   PMH, Medications, and Allergies reviewed and updated in chart as appropriate.   ROS negative except for what is listed in HPI. Objective:  BP 124/82   Pulse 96   Wt 194 lb (88 kg)   BMI 31.31 kg/m  Physical Exam Vitals and nursing note reviewed.  Constitutional:      Appearance: Normal appearance.  HENT:     Head: Normocephalic.  Eyes:     Pupils: Pupils are equal, round, and reactive to light.  Cardiovascular:     Rate and Rhythm: Normal rate and regular rhythm.     Pulses: Normal pulses.     Heart sounds: Normal heart sounds.  Pulmonary:     Effort: Pulmonary effort is normal.     Breath sounds: Normal breath sounds.  Musculoskeletal:        General: Normal range of motion.     Cervical back: Normal range of motion.  Skin:    General: Skin is warm.  Neurological:     General: No focal deficit present.     Mental Status: She is alert and oriented to person, place, and time.  Psychiatric:        Mood and  Affect: Mood normal.           Assessment & Plan:   Problem List Items Addressed This Visit     Encounter for health counseling related to travel - Primary    Travel Consultation for [place] Trip Plan: - Administer yellow fever vaccine - Check availability and administer polio booster if available - Recommend hepatitis A and typhoid fever vaccines, though not required - Prescribe malaria prophylaxis: atovaquone-proguanil (Malarone) 1 tablet daily starting 1-2 days before travel, continuing during travel, and for 7 days after returning (total of 20 tablets) - Prescribe traveler's diarrhea prophylaxis: azithromycin and metronidazole - Advise to spray clothes with insect repellent Permethrin - Provide hygiene and food precautions: take Imodium and Pepto-Bismol, avoid tap water and ice, consume only purified water and hot food - OCP sent for continuous use to aid in menstrual cycle control while traveling.       Relevant Medications   atovaquone-proguanil (MALARONE) 250-100 MG TABS tablet   metroNIDAZOLE (FLAGYL) 500 MG tablet   levonorgestrel-ethinyl estradiol (SEASONALE) 0.15-0.03 MG tablet   Other Relevant Orders   Poliovirus vaccine IPV subcutaneous/IM (Completed)      Tollie Eth, DNP, AGNP-c 11/27/2022  11:30 PM    History, Medications, Surgery, SDOH, and Family History reviewed and updated as appropriate.

## 2022-11-27 MED ORDER — LEVONORGEST-ETH ESTRAD 91-DAY 0.15-0.03 MG PO TABS
1.0000 | ORAL_TABLET | Freq: Every day | ORAL | 4 refills | Status: DC
Start: 2022-11-27 — End: 2023-04-29

## 2022-11-27 NOTE — Assessment & Plan Note (Signed)
Travel Consultation for [place] Trip Plan: - Administer yellow fever vaccine - Check availability and administer polio booster if available - Recommend hepatitis A and typhoid fever vaccines, though not required - Prescribe malaria prophylaxis: atovaquone-proguanil (Malarone) 1 tablet daily starting 1-2 days before travel, continuing during travel, and for 7 days after returning (total of 20 tablets) - Prescribe traveler's diarrhea prophylaxis: azithromycin and metronidazole - Advise to spray clothes with insect repellent Permethrin - Provide hygiene and food precautions: take Imodium and Pepto-Bismol, avoid tap water and ice, consume only purified water and hot food - OCP sent for continuous use to aid in menstrual cycle control while traveling.

## 2023-01-24 ENCOUNTER — Encounter: Payer: 59 | Admitting: Nurse Practitioner

## 2023-01-24 DIAGNOSIS — Z0000 Encounter for general adult medical examination without abnormal findings: Secondary | ICD-10-CM

## 2023-04-17 ENCOUNTER — Encounter: Payer: Self-pay | Admitting: Family Medicine

## 2023-04-17 ENCOUNTER — Ambulatory Visit: Payer: 59 | Admitting: Family Medicine

## 2023-04-17 DIAGNOSIS — I10 Essential (primary) hypertension: Secondary | ICD-10-CM

## 2023-04-17 DIAGNOSIS — N9489 Other specified conditions associated with female genital organs and menstrual cycle: Secondary | ICD-10-CM

## 2023-04-17 DIAGNOSIS — Z7184 Encounter for health counseling related to travel: Secondary | ICD-10-CM | POA: Diagnosis not present

## 2023-04-17 DIAGNOSIS — R350 Frequency of micturition: Secondary | ICD-10-CM | POA: Diagnosis not present

## 2023-04-17 LAB — POCT URINALYSIS DIP (CLINITEK)
Glucose, UA: NEGATIVE mg/dL
Ketones, POC UA: NEGATIVE mg/dL
Nitrite, UA: NEGATIVE
POC PROTEIN,UA: 100 — AB
Spec Grav, UA: 1.02 (ref 1.010–1.025)
Urobilinogen, UA: 0.2 U/dL
pH, UA: 6.5 (ref 5.0–8.0)

## 2023-04-17 MED ORDER — METRONIDAZOLE 500 MG PO TABS: 500.0000 mg | ORAL_TABLET | Freq: Two times a day (BID) | ORAL | 0 refills | Status: DC

## 2023-04-17 MED ORDER — AMLODIPINE BESYLATE 10 MG PO TABS: 10.0000 mg | ORAL_TABLET | Freq: Every day | ORAL | 3 refills | Status: DC

## 2023-04-17 MED ORDER — HYDROCHLOROTHIAZIDE 50 MG PO TABS: 50.0000 mg | ORAL_TABLET | Freq: Every day | ORAL | 3 refills | Status: DC

## 2023-04-17 NOTE — Addendum Note (Signed)
Addended byLawernce Pitts on: 04/17/2023 04:09 PM   Modules accepted: Orders

## 2023-04-17 NOTE — Progress Notes (Signed)
   Subjective:    Patient ID: Hannah Lutz, female    DOB: 1980/12/31, 41 y.o.   MRN: [id number]  HPI She is having difficulty with symptoms she thinks her BV related.  She has had previous difficulty with vaginal burning that has been intermittent in nature that did respond to therapy.  She is also on her menses right now which is about 2 weeks from her previous 1 which is unusual.  She also complains some slight frequency but has been drinking a lot of fluids. She did leave her antihypertensive medications going on a trip to [place] and would like a refill.  Review of Systems     Objective:    Physical Exam Alert and in no distress.  The urine shows gross hematuria but was nitrite negative.       Assessment & Plan:  Hypertension goal BP (blood pressure) < 130/80 - Plan: amLODipine (NORVASC) 10 MG tablet, hydrochlorothiazide (HYDRODIURIL) 50 MG tablet  Encounter for health counseling related to travel - Plan: metroNIDAZOLE (FLAGYL) 500 MG tablet  I will go ahead and treat her like she has BV and hopefully this will take care of it.  I do not think she has a UTI.

## 2023-04-29 ENCOUNTER — Other Ambulatory Visit: Payer: Self-pay

## 2023-04-29 ENCOUNTER — Emergency Department (HOSPITAL_COMMUNITY): Payer: 59

## 2023-04-29 ENCOUNTER — Emergency Department (HOSPITAL_COMMUNITY)
Admission: EM | Admit: 2023-04-29 | Discharge: 2023-04-29 | Disposition: A | Discharge status: Home / Self Care | Payer: 59 | Attending: Emergency Medicine | Admitting: Emergency Medicine

## 2023-04-29 DIAGNOSIS — N289 Disorder of kidney and ureter, unspecified: Secondary | ICD-10-CM | POA: Diagnosis not present

## 2023-04-29 DIAGNOSIS — R109 Unspecified abdominal pain: Secondary | ICD-10-CM | POA: Diagnosis present

## 2023-04-29 DIAGNOSIS — R319 Hematuria, unspecified: Secondary | ICD-10-CM

## 2023-04-29 LAB — URINALYSIS, W/ REFLEX TO CULTURE (INFECTION SUSPECTED)
Glucose, UA: NEGATIVE mg/dL
Ketones, ur: 5 mg/dL — AB
Nitrite: NEGATIVE
Protein, ur: 100 mg/dL — AB
Specific Gravity, Urine: 1.025 (ref 1.005–1.030)
pH: 5 (ref 5.0–8.0)

## 2023-04-29 LAB — HCG, SERUM, QUALITATIVE: Preg, Serum: NEGATIVE

## 2023-04-29 LAB — CBC WITH DIFFERENTIAL/PLATELET
Abs Immature Granulocytes: 0.05 10*3/uL (ref 0.00–0.07)
Basophils Absolute: 0.1 10*3/uL (ref 0.0–0.1)
Basophils Relative: 1 %
Eosinophils Absolute: 0.1 10*3/uL (ref 0.0–0.5)
Eosinophils Relative: 1 %
HCT: 47.3 % — ABNORMAL HIGH (ref 36.0–46.0)
Hemoglobin: 15.1 g/dL — ABNORMAL HIGH (ref 12.0–15.0)
Immature Granulocytes: 0 %
Lymphocytes Relative: 17 %
Lymphs Abs: 1.9 10*3/uL (ref 0.7–4.0)
MCH: 25.4 pg — ABNORMAL LOW (ref 26.0–34.0)
MCHC: 31.9 g/dL (ref 30.0–36.0)
MCV: 79.6 fL — ABNORMAL LOW (ref 80.0–100.0)
Monocytes Absolute: 1.2 10*3/uL — ABNORMAL HIGH (ref 0.1–1.0)
Monocytes Relative: 11 %
Neutro Abs: 8 10*3/uL — ABNORMAL HIGH (ref 1.7–7.7)
Neutrophils Relative %: 70 %
Platelets: 438 10*3/uL — ABNORMAL HIGH (ref 150–400)
RBC: 5.94 MIL/uL — ABNORMAL HIGH (ref 3.87–5.11)
RDW: 16.2 % — ABNORMAL HIGH (ref 11.5–15.5)
WBC: 11.4 10*3/uL — ABNORMAL HIGH (ref 4.0–10.5)
nRBC: 0 % (ref 0.0–0.2)

## 2023-04-29 LAB — COMPREHENSIVE METABOLIC PANEL
ALT: 35 U/L (ref 0–44)
AST: 34 U/L (ref 15–41)
Albumin: 3.9 g/dL (ref 3.5–5.0)
Alkaline Phosphatase: 94 U/L (ref 38–126)
Anion gap: 14 (ref 5–15)
BUN: 16 mg/dL (ref 6–20)
CO2: 22 mmol/L (ref 22–32)
Calcium: 9.4 mg/dL (ref 8.9–10.3)
Chloride: 94 mmol/L — ABNORMAL LOW (ref 98–111)
Creatinine, Ser: 2.17 mg/dL — ABNORMAL HIGH (ref 0.44–1.00)
GFR, Estimated: 28 mL/min — ABNORMAL LOW (ref >60)
Glucose, Bld: 102 mg/dL — ABNORMAL HIGH (ref 70–99)
Potassium: 3.2 mmol/L — ABNORMAL LOW (ref 3.5–5.1)
Sodium: 130 mmol/L — ABNORMAL LOW (ref 135–145)
Total Bilirubin: 0.9 mg/dL (ref <1.2)
Total Protein: 8.8 g/dL — ABNORMAL HIGH (ref 6.5–8.1)

## 2023-04-29 LAB — I-STAT CG4 LACTIC ACID, ED: Lactic Acid, Venous: 1.4 mmol/L (ref 0.5–1.9)

## 2023-04-29 LAB — BASIC METABOLIC PANEL
Anion gap: 11 (ref 5–15)
BUN: 17 mg/dL (ref 6–20)
CO2: 26 mmol/L (ref 22–32)
Calcium: 9.2 mg/dL (ref 8.9–10.3)
Chloride: 97 mmol/L — ABNORMAL LOW (ref 98–111)
Creatinine, Ser: 1.71 mg/dL — ABNORMAL HIGH (ref 0.44–1.00)
GFR, Estimated: 37 mL/min — ABNORMAL LOW (ref >60)
Glucose, Bld: 92 mg/dL (ref 70–99)
Potassium: 3.1 mmol/L — ABNORMAL LOW (ref 3.5–5.1)
Sodium: 134 mmol/L — ABNORMAL LOW (ref 135–145)

## 2023-04-29 MED ORDER — LACTATED RINGERS IV BOLUS
1000.0000 mL | Freq: Once | INTRAVENOUS | Status: AC
  Administered 2023-04-29: 1000 mL via INTRAVENOUS

## 2023-04-29 MED ORDER — CEPHALEXIN 250 MG PO CAPS: 250.0000 mg | ORAL_CAPSULE | Freq: Four times a day (QID) | ORAL | 0 refills | Status: DC

## 2023-04-29 MED ORDER — CEPHALEXIN 250 MG PO CAPS
250.0000 mg | ORAL_CAPSULE | Freq: Once | ORAL | Status: AC
  Administered 2023-04-29: 250 mg via ORAL
  Filled 2023-04-29: qty 1

## 2023-04-29 NOTE — ED Provider Notes (Signed)
  Physical Exam  BP 123/87   Pulse (!) 102   Temp 97.9 F (36.6 C)   Resp 17   Ht 5\' 5"  (1.651 m)   Wt 99.8 kg   LMP 04/16/2023   SpO2 100%   BMI 36.61 kg/m   Physical Exam Vitals and nursing note reviewed.  HENT:     Head: Normocephalic and atraumatic.  Eyes:     Pupils: Pupils are equal, round, and reactive to light.  Cardiovascular:     Rate and Rhythm: Normal rate and regular rhythm.  Pulmonary:     Effort: Pulmonary effort is normal.     Breath sounds: Normal breath sounds.  Abdominal:     Palpations: Abdomen is soft.     Tenderness: There is no abdominal tenderness.  Skin:    General: Skin is warm and dry.  Neurological:     Mental Status: She is alert.  Psychiatric:        Mood and Affect: Mood normal.     Procedures  Procedures  ED Course / MDM   Clinical Course as of 04/29/23 1910  Tue Apr 29, 2023  1903 CT shows nonobstructive kidney stone on the left.  No evidence of hydronephrosis.  UA shows evidence of contamination with white cells and red cells.  Renal function improved after IV fluids here.  Discussed these findings and rationale for admission for AKI versus discharge home with close PCP follow-up.  With the patient.  She wishes to follow-up with her primary care doctor for repeat UA laboratory workup next week sweating.  Return precautions discussed in detail.  Given her urinary symptoms and history of UTIs will treat empirically for UTI and give her first dose of antibiotics here. [MP]    Clinical Course User Index [MP] Royanne Foots, DO   Medical Decision Making I, Estelle June DO, have assumed care of this patient from the previous provider  Amount and/or Complexity of Data Reviewed Labs: ordered. Radiology: ordered.   Final diagnosis Renal dysfunction Hematuria       Royanne Foots, DO 04/29/23 1910

## 2023-04-29 NOTE — Discharge Instructions (Addendum)
You were seen in the emergency department for blood in your urine You may have a urinary tract infection which we have prescribed antibiotics for.  Please pick these up from your pharmacy and take as directed for the next 5 days Your kidney function was elevated today but improved after IV fluids It is important that you have your kidney function and urine rechecked next week by your primary care doctor Return to the emerged part for severe pain or any other concerns

## 2023-04-29 NOTE — ED Provider Notes (Signed)
[facility] AT [facility] Provider Note   CSN: [id number] Arrival date & time: 04/29/23  1047     History  Chief Complaint  Patient presents with   Abdominal Pain   Shortness of Breath    Hannah Lutz is a 42 y.o. female.  HPI Presents with concern of occasional numbness in her back, fleeting sensation of pain in her back, flank, and right sided abdomen.  Patient notes multiple medical problems, and states that she recently stopped drinking substantial amounts.  She has been traveling, by her account drinking during her travels, perhaps not water though. Over the past few days she has had worsening occasional discomfort as well as generalized fatigue, occasional numbness. No anterior chest pain, no dyspnea, no syncope, no fever, no vomiting.    Home Medications Prior Lutz Admission medications   Medication Sig Start Date End Date Taking? Authorizing Provider  amLODipine (NORVASC) 10 MG tablet Take 1 tablet (10 mg total) by mouth daily. Patient taking differently: Take 10 mg by mouth at bedtime. 04/17/23  Yes Ronnald Nian, MD  hydrochlorothiazide (HYDRODIURIL) 50 MG tablet Take 1 tablet (50 mg total) by mouth daily. Patient taking differently: Take 50 mg by mouth at bedtime. 04/17/23  Yes Ronnald Nian, MD  ibuprofen (ADVIL) 600 MG tablet Take 600 mg by mouth daily as needed for headache or moderate pain (pain score 4-6).   Yes [provider]  metroNIDAZOLE (FLAGYL) 500 MG tablet Take 1 tablet (500 mg total) by mouth 2 (two) times daily. For diarrhea 04/17/23  Yes Ronnald Nian, MD      Allergies    Robaxin [methocarbamol], Atacand [candesartan], Ultram [tramadol], and Zestril [lisinopril]    Review of Systems   Review of Systems  Physical Exam Updated Vital Signs BP (!) 131/96 (BP Location: Left Arm)   Pulse 91   Temp 97.9 F (36.6 C)   Resp 17   Ht 5\' 5"  (1.651 m)   Wt 99.8 kg   LMP 04/16/2023   SpO2 100%   BMI  36.61 kg/m  Physical Exam Vitals and nursing note reviewed.  Constitutional:      General: She is not in acute distress.    Appearance: She is well-developed.  HENT:     Head: Normocephalic and atraumatic.  Eyes:     Conjunctiva/sclera: Conjunctivae normal.  Cardiovascular:     Rate and Rhythm: Normal rate and regular rhythm.  Pulmonary:     Effort: Pulmonary effort is normal. No respiratory distress.     Breath sounds: Normal breath sounds. No stridor.  Abdominal:     General: There is no distension.     Tenderness: There is no abdominal tenderness.  Skin:    General: Skin is warm and dry.  Neurological:     Mental Status: She is alert and oriented Lutz person, place, and time.     Cranial Nerves: No cranial nerve deficit.  Psychiatric:        Mood and Affect: Mood normal.     ED Results / Procedures / Treatments   Labs (all labs ordered are listed, but only abnormal results are displayed) Labs Reviewed  COMPREHENSIVE METABOLIC PANEL - Abnormal; Notable for the following components:      Result Value   Sodium 130 (*)    Potassium 3.2 (*)    Chloride 94 (*)    Glucose, Bld 102 (*)    Creatinine, Ser 2.17 (*)    Total Protein  8.8 (*)    GFR, Estimated 28 (*)    All other components within normal limits  CBC WITH DIFFERENTIAL/PLATELET - Abnormal; Notable for the following components:   WBC 11.4 (*)    RBC 5.94 (*)    Hemoglobin 15.1 (*)    HCT 47.3 (*)    MCV 79.6 (*)    MCH 25.4 (*)    RDW 16.2 (*)    Platelets 438 (*)    Neutro Abs 8.0 (*)    Monocytes Absolute 1.2 (*)    All other components within normal limits  HCG, SERUM, QUALITATIVE  URINALYSIS, W/ REFLEX Lutz CULTURE (INFECTION SUSPECTED)  I-STAT CG4 LACTIC ACID, ED    EKG None  Radiology No results found.  Procedures Procedures    Medications Ordered in ED Medications  lactated ringers bolus 1,000 mL (0 mLs Intravenous Stopped 04/29/23 1517)  lactated ringers bolus 1,000 mL (1,000 mLs  Intravenous New Bag/Given 04/29/23 1520)    ED Course/ Medical Decision Making/ A&P                                 Medical Decision Making Adult female with multiple medical problems, now presents with flank pain, intermittent discomfort, paresthesia, but no chest pain, no dyspnea.  Broad differential including electrolyte abnormalities, infection, intra-abdominal processes, less likely stroke considered. Cardiac 95 sinus normal Pulse ox 100% room air normal CT, labs, UA ordered.   Amount and/or Complexity of Data Reviewed External Data Reviewed: notes. Labs: ordered. Decision-making details documented in ED Course. Radiology: ordered.  Update: Patient's initial labs notable for creatinine greater than 2, more than double her most recent value.  She is receiving fluid resuscitation. 3:57 PM Now following 1 L, and in the process of a second liter resuscitation the patient feels markedly better, she notes that she no longer has any neck discomfort, flank pain.  I have reviewed her labs, discussed them with her, evidence for substantially worsened renal function compared Lutz 1 year ago.  BUN is unremarkable suggesting progressive dysfunction, possibly exacerbated by her alcohol intake.  Patient is awaiting CT scan, and urinalysis.        Final Clinical Impression(s) / ED Diagnoses Final diagnoses:  Renal dysfunction    Rx / DC Orders ED Discharge Orders     None         Gerhard Munch, MD 04/29/23 1557

## 2023-04-29 NOTE — ED Triage Notes (Addendum)
Pt. Stated Im having SOB  and stomach pain and I get cramps in my stomach and neck  This started on SAturday

## 2023-05-05 ENCOUNTER — Ambulatory Visit: Payer: 59 | Admitting: Nurse Practitioner

## 2023-05-05 ENCOUNTER — Encounter: Payer: Self-pay | Admitting: Nurse Practitioner

## 2023-05-05 VITALS — BP 126/82 | HR 104 | Wt 197.8 lb

## 2023-05-05 DIAGNOSIS — E876 Hypokalemia: Secondary | ICD-10-CM

## 2023-05-05 DIAGNOSIS — E871 Hypo-osmolality and hyponatremia: Secondary | ICD-10-CM

## 2023-05-05 DIAGNOSIS — M25562 Pain in left knee: Secondary | ICD-10-CM | POA: Insufficient documentation

## 2023-05-05 DIAGNOSIS — E559 Vitamin D deficiency, unspecified: Secondary | ICD-10-CM

## 2023-05-05 DIAGNOSIS — D751 Secondary polycythemia: Secondary | ICD-10-CM

## 2023-05-05 DIAGNOSIS — N179 Acute kidney failure, unspecified: Secondary | ICD-10-CM | POA: Diagnosis not present

## 2023-05-05 LAB — POCT URINALYSIS DIP (CLINITEK)
Bilirubin, UA: NEGATIVE
Glucose, UA: NEGATIVE mg/dL
Ketones, POC UA: NEGATIVE mg/dL
Leukocytes, UA: NEGATIVE
Nitrite, UA: NEGATIVE
POC PROTEIN,UA: 30 — AB
Spec Grav, UA: 1.015 (ref 1.010–1.025)
Urobilinogen, UA: 0.2 U/dL
pH, UA: 6.5 (ref 5.0–8.0)

## 2023-05-05 MED ORDER — VITAMIN D (ERGOCALCIFEROL) 1.25 MG (50000 UNIT) PO CAPS: 50000.0000 [IU] | ORAL_CAPSULE | ORAL | 3 refills | Status: AC

## 2023-05-05 NOTE — Assessment & Plan Note (Signed)
Intermittent joint pain, particularly in the knee and heel, possibly related to kidney dysfunction and dehydration. Pain not alleviated by ibuprofen. Discussed potential kidney impact of NSAIDs and alternative pain management strategies. - Advise minimizing ibuprofen intake due to potential kidney impact - Recommend icing the affected areas - Consider alternative pain management strategies if symptoms persist

## 2023-05-05 NOTE — Patient Instructions (Addendum)
Drink plenty of water or electrolyte drinks to help your kidneys function better. Avoid alcohol completely for at least the next month.    Acute Kidney Injury, Adult Acute kidney injury is a sudden decrease in the ability of the kidneys to do what they are supposed to do. The kidneys are a pair of organs that: Make urine. Make hormones. Keep the right amount of fluids and chemicals in the body. This condition ranges from mild to severe. Over time, it may turn into long-term (chronic) kidney disease. Finding and treating the injury Hannah Lutz may keep it from turning into chronic kidney disease. What are the causes? Common causes of this condition include: A problem with blood flow to the kidneys. This may be caused by: Low blood pressure, shock, or severe dehydration. Severe blood loss. Heart and blood vessel disease. Severe burns. Liver disease. Direct damage to the kidneys. This may be caused by: Certain medicines or toxins. Kidney disease. Contrast dye used in imaging tests. An infection of the kidney or bloodstream. Problems from surgery. Trauma to the kidney area. Organ failure. This includes heart or liver failure. A sudden block in urine flow. This may be caused by: Cancer. Kidney stones. An enlarged prostate. What increases the risk? You may be more likely to develop this condition if: You are older than 44 years of age. You are female. You are in the hospital. You may be even more at risk if you are very sick. You have certain conditions. These may include: Chronic kidney or liver disease. Diabetes. Heart disease and heart failure. Lung disease. What are the signs or symptoms? This condition may not cause symptoms until it becomes severe. If it does, symptoms may include: Feeling very tired or having trouble staying awake. Nausea or vomiting. Swelling (edema) of the face, legs, ankles, or feet. Pain in your abdomen, back, or along the side of your back (flank). Urine  changes. You may: Make little or no urine. Pass urine with a weak flow. Muscle twitches and cramps. These are most often in the legs. Confusion or trouble focusing. Not feeling the urge to eat. Fever. How is this diagnosed? This condition may be diagnosed based on your symptoms and your medical history. You may have a physical exam done. You may also have tests, such as: Blood tests. Urine tests. Imaging tests. A kidney biopsy. This is when a sample of kidney tissue is removed and looked at under a microscope. How is this treated? Treatment depends on the cause and how severe the condition is. In mild cases, treatment may not be needed. The kidneys may heal on their own. In severe cases, treatment may include: Treating the cause of the kidney injury. This may mean that you have to change your medicines or the doses you take. Getting fluids through an IV tube. Having a flexible tube (catheter) put in. This tube will drain urine and prevent blockages. Trying to keep problems from starting. This may mean not using certain medicines or not having tests done that could cause more injury. In some cases, you may also need: Dialysis or continuous renal replacement therapy (CRRT). This treatment uses a machine to do the job of the kidneys. Surgery. This may be done to repair a damaged kidney. It could also be done to remove a blockage in the urinary tract. Follow these instructions at home: Medicines Take over-the-counter and prescription medicines only as told by your health care provider. Do not take new medicines unless approved by your health  care provider. Many medicines can make kidney damage worse. Do not take vitamin or mineral supplements unless approved by your health care provider. Some of these can make kidney damage worse. Lifestyle  Make changes to your diet as told by your health care provider. You may need to eat less protein. Get to, and stay at, a healthy weight. If you need  help, ask your health care provider. Start or keep up an exercise plan. Exercise at least 30 minutes a day, 5 days a week. Do not use any products that contain nicotine or tobacco. These products include cigarettes, chewing tobacco, and vaping devices, such as e-cigarettes. If you need help quitting, ask your health care provider. General instructions  Keep track of your blood pressure. Tell your health care provider if you notice any changes. Keep your vaccines up to date. Ask your health care provider which vaccines you need. Keep all follow-up visits. Your health care provider will need to monitor your kidneys. Where to find support American Association of Kidney Patients: https://www.miller-montoya.com/ American Kidney Fund: [place].com.au Where to find more information [employer]: kidney.org Medical Education Institute: LifeOptions: lifeoptions.org Kidney School: kidneyschool.org Contact a health care provider if: Your symptoms get worse. You have new symptoms, such as: Headaches. Skin that is darker or lighter than normal. Easy bruising. Feeling itchy. Hiccups. Lack of menstrual periods. You have a fever. Get help right away if: You have signs of severe kidney disease, such as: Chest pain. Shortness of breath. Seizures. You have pain or bleeding when you pass urine. You make little or no urine. These symptoms may be an emergency. Get help right away. Call 911. Do not wait to see if the symptoms will go away. Do not drive yourself to the hospital. This information is not intended to replace advice given to you by your health care provider. Make sure you discuss any questions you have with your health care provider. Document Revised: 12/21/2021 Document Reviewed: 12/21/2021 Elsevier Patient Education  2024 [employer].

## 2023-05-05 NOTE — Progress Notes (Signed)
Tollie Eth, DNP, AGNP-c [facility] [address] [place], [place] [address] [phone]   ACUTE VISIT- ESTABLISHED PATIENT  Blood pressure 126/82, pulse (!) 104, weight 197 lb 12.8 oz (89.7 kg), last menstrual period 04/16/2023.  Subjective:  HPI Hannah Lutz is a 42 y.o. female presents to day for evaluation of acute concern(s).   History of Present Illness Kamini presents with concerns about recent changes in her health and ED visit. She reports a period of heavy alcohol consumption and dehydration while on vacation recently, which she believes has led to her current symptoms. She describes experiencing muscle spasms in her back, vomiting, and an inability to eat. She also notes that her urine was brown, indicating severe dehydration. She was seen in the ED and noted to have AKI with elevated creatinine of 2+. She was treated with IV fluids and antibiotics for UTI, which she has completed.   She was prescribed metronidazole for a suspected bacterial vaginosis (BV) infection but has not yet started the medication.    She also reports intermittent lower back pain and knee pain, which started recently without any known injury or strain.  The patient acknowledges her recent lifestyle choices, particularly heavy drinking and inadequate hydration, as potential contributors to her current health issues. She expresses a commitment to improving her hydration and reducing alcohol intake to help her kidneys recover. She also expresses concern about her kidney function, asking for clarification about her test results and the potential long-term impact on her health.  The patient also mentions a recent trip to [place], during which she continued her pattern of heavy drinking. She describes her current lifestyle as busy and stressful, which may have contributed to her inadequate hydration and reliance on alcohol.  ROS negative except for what is listed in  HPI. History, Medications, Surgery, SDOH, and Family History reviewed and updated as appropriate.  Objective:  Physical Exam Vitals and nursing note reviewed.  Constitutional:      General: She is not in acute distress.    Appearance: Normal appearance. She is obese. She is not ill-appearing.  HENT:     Head: Normocephalic.  Eyes:     General: No scleral icterus.    Conjunctiva/sclera: Conjunctivae normal.     Pupils: Pupils are equal, round, and reactive to light.  Neck:     Vascular: No carotid bruit.  Cardiovascular:     Rate and Rhythm: Normal rate and regular rhythm.     Pulses: Normal pulses.     Heart sounds: Normal heart sounds.  Pulmonary:     Effort: Pulmonary effort is normal.     Breath sounds: Normal breath sounds.  Abdominal:     General: Bowel sounds are normal.     Palpations: Abdomen is soft.  Musculoskeletal:     Right lower leg: No edema.     Left lower leg: No edema.  Lymphadenopathy:     Cervical: No cervical adenopathy.  Skin:    General: Skin is warm and dry.     Capillary Refill: Capillary refill takes less than 2 seconds.  Neurological:     General: No focal deficit present.     Mental Status: She is alert and oriented to person, place, and time.  Psychiatric:        Mood and Affect: Mood normal.        Behavior: Behavior normal.          Assessment & Plan:   Problem List Items Addressed  This Visit     AKI (acute kidney injury) (HCC) - Primary    AKI likely secondary to severe dehydration and excessive alcohol intake. Initial creatinine elevated to 2, indicating reduced kidney function. Proteinuria and hematuria present today but improving. No bilirubin detected, and ketones are negative. Creatinine clearance dropped to 28 mL/min but expected to improve with hydration and reduced alcohol intake. Discussed risks of continued alcohol consumption, including potential for permanent kidney damage and electrolyte imbalances. Emphasized importance  of hydration and avoiding alcohol and caffeine. Also discussed importance of avoiding medications that can be hard on the kidneys such as ibuprofen.  - Encourage increased water intake - Advise minimizing alcohol consumption - Order blood work to check creatinine levels - Reassess kidney function in four weeks      Relevant Orders   Comprehensive metabolic panel   CBC with Differential/Platelet   POCT URINALYSIS DIP (CLINITEK) (Completed)   Arthralgia of left knee    Intermittent joint pain, particularly in the knee and heel, possibly related to kidney dysfunction and dehydration. Pain not alleviated by ibuprofen. Discussed potential kidney impact of NSAIDs and alternative pain management strategies. - Advise minimizing ibuprofen intake due to potential kidney impact - Recommend icing the affected areas - Consider alternative pain management strategies if symptoms persist      Other Visit Diagnoses     Hypokalemia       Relevant Orders   Comprehensive metabolic panel   CBC with Differential/Platelet   Hyponatremia       Relevant Orders   Comprehensive metabolic panel   CBC with Differential/Platelet   Polycythemia       Relevant Orders   Comprehensive metabolic panel   CBC with Differential/Platelet   Vitamin D deficiency       Relevant Medications   Vitamin D, Ergocalciferol, (DRISDOL) 1.25 MG (50000 UNIT) CAPS capsule         Tollie Eth, DNP, AGNP-c

## 2023-05-05 NOTE — Assessment & Plan Note (Signed)
AKI likely secondary to severe dehydration and excessive alcohol intake. Initial creatinine elevated to 2, indicating reduced kidney function. Proteinuria and hematuria present today but improving. No bilirubin detected, and ketones are negative. Creatinine clearance dropped to 28 mL/min but expected to improve with hydration and reduced alcohol intake. Discussed risks of continued alcohol consumption, including potential for permanent kidney damage and electrolyte imbalances. Emphasized importance of hydration and avoiding alcohol and caffeine. Also discussed importance of avoiding medications that can be hard on the kidneys such as ibuprofen.  - Encourage increased water intake - Advise minimizing alcohol consumption - Order blood work to check creatinine levels - Reassess kidney function in four weeks

## 2023-05-06 LAB — CBC WITH DIFFERENTIAL/PLATELET
Basophils Absolute: 0.1 10*3/uL (ref 0.0–0.2)
Basos: 1 %
EOS (ABSOLUTE): 0.2 10*3/uL (ref 0.0–0.4)
Eos: 2 %
Hematocrit: 43 % (ref 34.0–46.6)
Hemoglobin: 13.4 g/dL (ref 11.1–15.9)
Immature Grans (Abs): 0 10*3/uL (ref 0.0–0.1)
Immature Granulocytes: 0 %
Lymphocytes Absolute: 1.9 10*3/uL (ref 0.7–3.1)
Lymphs: 16 %
MCH: 25.9 pg — ABNORMAL LOW (ref 26.6–33.0)
MCHC: 31.2 g/dL — ABNORMAL LOW (ref 31.5–35.7)
MCV: 83 fL (ref 79–97)
Monocytes Absolute: 1.3 10*3/uL — ABNORMAL HIGH (ref 0.1–0.9)
Monocytes: 11 %
Neutrophils Absolute: 8.1 10*3/uL — ABNORMAL HIGH (ref 1.4–7.0)
Neutrophils: 70 %
Platelets: 400 10*3/uL (ref 150–450)
RBC: 5.18 x10E6/uL (ref 3.77–5.28)
RDW: 14.5 % (ref 11.7–15.4)
WBC: 11.6 10*3/uL — ABNORMAL HIGH (ref 3.4–10.8)

## 2023-05-06 LAB — COMPREHENSIVE METABOLIC PANEL
ALT: 28 [IU]/L (ref 0–32)
AST: 24 [IU]/L (ref 0–40)
Albumin: 4.1 g/dL (ref 3.9–4.9)
Alkaline Phosphatase: 87 IU/L (ref 44–121)
BUN/Creatinine Ratio: 11 (ref 9–23)
BUN: 11 mg/dL (ref 6–24)
Bilirubin Total: 0.3 mg/dL (ref 0.0–1.2)
CO2: 25 mmol/L (ref 20–29)
Calcium: 9.5 mg/dL (ref 8.7–10.2)
Chloride: 98 mmol/L (ref 96–106)
Creatinine, Ser: 0.98 mg/dL (ref 0.57–1.00)
Globulin, Total: 3.6 g/dL (ref 1.5–4.5)
Glucose: 90 mg/dL (ref 70–99)
Potassium: 4 mmol/L (ref 3.5–5.2)
Sodium: 137 mmol/L (ref 134–144)
Total Protein: 7.7 g/dL (ref 6.0–8.5)
eGFR: 73 mL/min/{1.73_m2} (ref >59)

## 2023-05-13 ENCOUNTER — Encounter: Payer: Self-pay | Admitting: Family Medicine

## 2023-05-13 ENCOUNTER — Ambulatory Visit: Payer: 59 | Admitting: Family Medicine

## 2023-05-13 VITALS — BP 130/86 | HR 112 | Ht 65.0 in | Wt 192.4 lb

## 2023-05-13 DIAGNOSIS — B9689 Other specified bacterial agents as the cause of diseases classified elsewhere: Secondary | ICD-10-CM

## 2023-05-13 DIAGNOSIS — N760 Acute vaginitis: Secondary | ICD-10-CM | POA: Diagnosis not present

## 2023-05-13 MED ORDER — METRONIDAZOLE 500 MG PO TABS: 500.0000 mg | ORAL_TABLET | Freq: Two times a day (BID) | ORAL | 0 refills | Status: DC

## 2023-05-13 NOTE — Progress Notes (Signed)
   Subjective:    Patient ID: Hannah Lutz, female    DOB: 1980/09/14, 42 y.o.   MRN: [id number]  HPI She complains of a vaginal burning sensation that she states is somehow different.  Previous episodes of BV.  No discharge, dysuria, urgency or frequency.  No abdominal pain.   Review of Systems     Objective:    Physical Exam Vaginal exam shows normal mucosa with no drainage erythema or tenderness to palpation.  KOH and wet prep are negative.       Assessment & Plan:  BV (bacterial vaginosis) - Plan: metroNIDAZOLE (FLAGYL) 500 MG tablet I explained that if this does not take care of it, we might need to have you see a gynecologist since it does appear to be slightly different than the previous BV level.

## 2023-06-03 ENCOUNTER — Other Ambulatory Visit: Payer: 59

## 2023-06-12 ENCOUNTER — Other Ambulatory Visit: Payer: 59

## 2023-06-20 ENCOUNTER — Other Ambulatory Visit: Payer: 59

## 2023-06-20 DIAGNOSIS — N289 Disorder of kidney and ureter, unspecified: Secondary | ICD-10-CM

## 2023-06-21 LAB — COMPREHENSIVE METABOLIC PANEL
ALT: 22 [IU]/L (ref 0–32)
AST: 24 [IU]/L (ref 0–40)
Albumin: 3.8 g/dL — ABNORMAL LOW (ref 3.9–4.9)
Alkaline Phosphatase: 88 [IU]/L (ref 44–121)
BUN/Creatinine Ratio: 12 (ref 9–23)
BUN: 12 mg/dL (ref 6–24)
Bilirubin Total: 0.3 mg/dL (ref 0.0–1.2)
CO2: 24 mmol/L (ref 20–29)
Calcium: 9.1 mg/dL (ref 8.7–10.2)
Chloride: 98 mmol/L (ref 96–106)
Creatinine, Ser: 0.97 mg/dL (ref 0.57–1.00)
Globulin, Total: 3.4 g/dL (ref 1.5–4.5)
Glucose: 116 mg/dL — ABNORMAL HIGH (ref 70–99)
Potassium: 3.8 mmol/L (ref 3.5–5.2)
Sodium: 136 mmol/L (ref 134–144)
Total Protein: 7.2 g/dL (ref 6.0–8.5)
eGFR: 74 mL/min/{1.73_m2} (ref >59)

## 2023-08-27 ENCOUNTER — Ambulatory Visit: Admitting: Family Medicine

## 2023-08-27 ENCOUNTER — Encounter: Payer: Self-pay | Admitting: Family Medicine

## 2023-08-27 VITALS — BP 120/78 | HR 109 | Temp 98.6°F | Ht 65.0 in | Wt 198.2 lb

## 2023-08-27 DIAGNOSIS — R300 Dysuria: Secondary | ICD-10-CM | POA: Diagnosis not present

## 2023-08-27 DIAGNOSIS — R310 Gross hematuria: Secondary | ICD-10-CM | POA: Diagnosis not present

## 2023-08-27 DIAGNOSIS — T148XXA Other injury of unspecified body region, initial encounter: Secondary | ICD-10-CM

## 2023-08-27 DIAGNOSIS — M545 Low back pain, unspecified: Secondary | ICD-10-CM | POA: Diagnosis not present

## 2023-08-27 LAB — POCT URINALYSIS DIP (CLINITEK)
Bilirubin, UA: NEGATIVE
Glucose, UA: NEGATIVE mg/dL
Ketones, POC UA: NEGATIVE mg/dL
Leukocytes, UA: NEGATIVE
Nitrite, UA: NEGATIVE
Spec Grav, UA: 1.015 (ref 1.010–1.025)
Urobilinogen, UA: 0.2 U/dL
pH, UA: 6.5 (ref 5.0–8.0)

## 2023-08-27 MED ORDER — BACLOFEN 10 MG PO TABS: 10.0000 mg | ORAL_TABLET | Freq: Three times a day (TID) | ORAL | 0 refills | Status: AC

## 2023-08-27 NOTE — Patient Instructions (Signed)
 I have sent in a muscle relaxer for you to use one tablet as needed for muscle spasms. This medication can make you sleepy. Do not drive until you know how this medication affects you.  May use heat or ice to the area for relief as needed.  May use topical rubs or gels to the area as needed for relief.  Attached subtle stretching exercises to help relieve pain and strengthen muscles.   Urine does have some blood today, will culture to check for infection.  Follow-up with me for new or worsening symptoms.

## 2023-08-27 NOTE — Progress Notes (Signed)
 Acute Office Visit  Subjective:     Patient ID: Hannah Lutz, female    DOB: May 06, 1981, 43 y.o.   MRN: [id number]  Chief Complaint  Patient presents with   Acute Visit    UTI symptoms for about a week, lower back smell, odd urine odor, not emptying bladder    HPI Patient is in today for evaluation of dysuria and low back pain. Reports right sided low back pain intermittently for the last couple of months. It is worse with activity. Has used heat with some mild relief. Has taken ibuprofen and Tylenol with some mild relief. She walks a lot at work, this aggravates her pain. Denies known injury, numbness, tingling, radiating pain, loss of bowel or bladder control, loss of strength in any extremities.  Does report dark urine, has had blood in urine before. Concern for UTI today. Has not attempted OTC treatment for this    ROS Per HPI      Objective:    BP 120/78 (BP Location: Left Arm, Patient Position: Sitting)   Pulse (!) 109   Temp 98.6 F (37 C) (Temporal)   Ht 5\' 5"  (1.651 m)   Wt 198 lb 3.2 oz (89.9 kg)   SpO2 98%   BMI 32.98 kg/m    Physical Exam Vitals and nursing note reviewed.  Constitutional:      General: She is not in acute distress.    Appearance: Normal appearance. She is obese.  HENT:     Head: Normocephalic and atraumatic.  Eyes:     Extraocular Movements: Extraocular movements intact.  Cardiovascular:     Rate and Rhythm: Normal rate.  Pulmonary:     Effort: Pulmonary effort is normal.  Musculoskeletal:        General: Swelling and tenderness present.     Cervical back: Normal range of motion.     Comments: Right low back paraspinous muscles mildly swollen, mildly tender, and spasm  Neurological:     General: No focal deficit present.     Mental Status: She is alert and oriented to person, place, and time.  Psychiatric:        Mood and Affect: Mood normal.        Thought Content: Thought content normal.    Results for orders  placed or performed in visit on 08/27/23  POCT URINALYSIS DIP (CLINITEK)  Result Value Ref Range   Color, UA yellow yellow   Clarity, UA cloudy (A) clear   Glucose, UA negative negative mg/dL   Bilirubin, UA negative negative   Ketones, POC UA negative negative mg/dL   Spec Grav, UA 8.469 6.295 - 1.025   Blood, UA moderate (A) negative   pH, UA 6.5 5.0 - 8.0   POC PROTEIN,UA trace negative, trace   Urobilinogen, UA 0.2 0.2 or 1.0 E.U./dL   Nitrite, UA Negative Negative   Leukocytes, UA Negative Negative        Assessment & Plan:   Gross hematuria -     POCT URINALYSIS DIP (CLINITEK) -     Urine Culture; Future  Dysuria -     POCT URINALYSIS DIP (CLINITEK) -     Urine Culture; Future  Muscle strain -     Baclofen; Take 1 tablet (10 mg total) by mouth 3 (three) times daily.  Dispense: 30 each; Refill: 0  Acute right-sided low back pain without sciatica -     Baclofen; Take 1 tablet (10 mg total) by mouth 3 (three) times  daily.  Dispense: 30 each; Refill: 0  Discussed that this medication can make you sleepy, do not take this medication with alcohol. Do not take this medication and drive until you know how it will affect you.  Sending urine for culture, discussed urology follow-up   Meds ordered this encounter  Medications   baclofen (LIORESAL) 10 MG tablet    Sig: Take 1 tablet (10 mg total) by mouth 3 (three) times daily.    Dispense:  30 each    Refill:  0    Return if symptoms worsen or fail to improve.  Moshe Cipro, FNP

## 2023-08-28 ENCOUNTER — Ambulatory Visit: Admitting: Medical

## 2023-08-28 LAB — URINE CULTURE: Result:: NO GROWTH

## 2023-08-29 ENCOUNTER — Encounter: Payer: Self-pay | Admitting: Family Medicine

## 2023-11-25 ENCOUNTER — Telehealth: Payer: Self-pay | Admitting: *Deleted

## 2023-11-25 NOTE — Telephone Encounter (Signed)
 Copied from CRM [id number]. Topic: Clinical - Lab/Test Results >> Nov 25, 2023  9:57 AM Adan Adas P wrote: Reason for CRM: Patient would like her daughters chart records 12/23/05, sent to [email or web address] please, says she she spoke to someone last time she was there to send it and never received it she stated.  Called patient back to find out her daughters name and it was not in the message as well as what records specifically were needed. Her daughters name is Reizel Calzada DOB 12/23/05 and she is requesting immunization records for college to be emailed. Done.

## 2023-12-11 ENCOUNTER — Ambulatory Visit (INDEPENDENT_AMBULATORY_CARE_PROVIDER_SITE_OTHER)

## 2023-12-11 ENCOUNTER — Ambulatory Visit: Admitting: Podiatry

## 2023-12-11 ENCOUNTER — Encounter: Payer: Self-pay | Admitting: Podiatry

## 2023-12-11 DIAGNOSIS — M722 Plantar fascial fibromatosis: Secondary | ICD-10-CM | POA: Diagnosis not present

## 2023-12-11 DIAGNOSIS — M7662 Achilles tendinitis, left leg: Secondary | ICD-10-CM | POA: Diagnosis not present

## 2023-12-11 MED ORDER — TRIAMCINOLONE ACETONIDE 10 MG/ML IJ SUSP: 10.0000 mg | Freq: Once | INTRAMUSCULAR | Status: AC

## 2023-12-11 MED ORDER — PREDNISONE 10 MG PO TABS: ORAL_TABLET | ORAL | 0 refills | Status: AC

## 2023-12-11 MED ORDER — DICLOFENAC SODIUM 75 MG PO TBEC: 75.0000 mg | DELAYED_RELEASE_TABLET | Freq: Two times a day (BID) | ORAL | 2 refills | Status: DC

## 2023-12-11 NOTE — Patient Instructions (Signed)

## 2023-12-12 NOTE — Progress Notes (Signed)
 Subjective:   Patient ID: Hannah Lutz, female   DOB: 43 y.o.   MRN: [id number]   HPI Patient presents stating she has had a lot of pain in her left heel a 5-week duration and it seems to hurt on the bottom of the back and she is also getting some forefoot pain and works on cement floors.  Patient does not smoke and likes to be active as best she can and has not been able to   Review of Systems  All other systems reviewed and are negative.       Objective:  Physical Exam Vitals and nursing note reviewed.  Constitutional:      Appearance: She is well-developed.  Pulmonary:     Effort: Pulmonary effort is normal.   Musculoskeletal:        General: Normal range of motion.   Skin:    General: Skin is warm.   Neurological:     Mental Status: She is alert.     Neurovascular status was found to be intact muscle strength found to be adequate range of motion adequate splinting of the Achilles with moderate posterior pain and equinus condition with exquisite discomfort also noted plantar aspect of the left heel at the medial band insertional point.  Moderate forefoot pain which is most likely compensatory with patient found to have good digital perfusion well-oriented     Assessment:  Probability for acute plantar fasciitis with compensatory Achilles tendinitis and forefoot discomfort     Plan:  H&P x-rays reviewed and were to focus on the plantar fascia as it is most likely the initial problem.  Sterile prep injected the fascia at insertion 3 mg Kenalog  5 mg Xylocaine  applied fascial brace to lift up the arch gave instructions for shoe gear modification and stretching exercises and reappoint to recheck  X-rays indicate that there is spurring of the plantar left heel no indication of stress fracture or arthritis

## 2023-12-23 ENCOUNTER — Telehealth: Payer: Self-pay | Admitting: Podiatry

## 2023-12-23 ENCOUNTER — Ambulatory Visit: Admitting: Nurse Practitioner

## 2023-12-23 MED ORDER — NAPROXEN 500 MG PO TABS: 500.0000 mg | ORAL_TABLET | Freq: Two times a day (BID) | ORAL | 0 refills | Status: AC

## 2023-12-23 NOTE — Telephone Encounter (Signed)
 Patient called back and received the message. She states that Dr. Magdalen prescribed diclofenac  (Voltaren ) 75 mg EC tablet, but it was not effective . She is requesting an alternative pain medication to manage her symptoms until her follow-up appointment on 7/17 w/ Dr. Magdalen.

## 2023-12-23 NOTE — Telephone Encounter (Signed)
 Heather or Delydia, could you please call the patient and relay Dr. Darron message?  Thank you!

## 2023-12-23 NOTE — Telephone Encounter (Signed)
 Patient received an injection in the left foot on 6/26 by Dr. Magdalen. She reports persistent pain radiating up the leg, similar to the pain experienced on the day of the visit. Additionally, she now reports that the pain extends up to her hand, causing difficulty bending and making a fist. Please advise. Patient  is wanting to know if this normal.

## 2024-01-01 ENCOUNTER — Encounter: Payer: Self-pay | Admitting: Podiatry

## 2024-01-01 ENCOUNTER — Ambulatory Visit: Admitting: Podiatry

## 2024-01-01 DIAGNOSIS — M722 Plantar fascial fibromatosis: Secondary | ICD-10-CM

## 2024-01-01 DIAGNOSIS — M7662 Achilles tendinitis, left leg: Secondary | ICD-10-CM

## 2024-01-02 NOTE — Progress Notes (Signed)
 Subjective:   Patient ID: Hannah Lutz, female   DOB: 43 y.o.   MRN: [id number]   HPI Patient presents stating still having a lot of pain in the heel left and that the injection was temporary and it is now back.  States that extremely sore in the back of the heel the bottom of the heel side of the heel and dorsal foot   ROS      Objective:  Physical Exam  Neurovascular status intact inflammation consistent with fluid buildup around the heel with significant compensatory pains which are becoming more bothersome     Assessment:  Acute inflammatory process with intense discomfort occurring in numerous areas foot and into the Achilles     Plan:  8P due to the tenseness of discomfort I went ahead applied air fracture walker properly fitted to the lower leg gave instructions on elevation and reduced activity and wearing boot at all times.  Reappoint 4 weeks or earlier if necessary

## 2024-01-02 NOTE — Patient Instructions (Signed)
 Back to where it was again

## 2024-04-08 ENCOUNTER — Encounter: Admitting: Nurse Practitioner

## 2024-06-14 ENCOUNTER — Other Ambulatory Visit: Payer: Self-pay | Admitting: Family Medicine

## 2024-06-14 ENCOUNTER — Other Ambulatory Visit: Payer: Self-pay | Admitting: Nurse Practitioner

## 2024-06-14 DIAGNOSIS — I10 Essential (primary) hypertension: Secondary | ICD-10-CM

## 2024-07-16 ENCOUNTER — Other Ambulatory Visit: Payer: Self-pay | Admitting: Nurse Practitioner

## 2024-07-16 DIAGNOSIS — I10 Essential (primary) hypertension: Secondary | ICD-10-CM
